# Patient Record
Sex: Male | Born: 1990 | Race: Black or African American | Hispanic: No | Marital: Single | Smoking: Never smoker
Health system: Southern US, Community
[De-identification: ages and names within clinical notes are randomized; demographics above are authoritative.]

## PROBLEM LIST (undated history)

## (undated) DIAGNOSIS — D571 Sickle-cell disease without crisis: Secondary | ICD-10-CM

## (undated) DIAGNOSIS — I10 Essential (primary) hypertension: Secondary | ICD-10-CM

## (undated) HISTORY — PX: CHOLECYSTECTOMY: SHX55

---

## 2010-10-18 ENCOUNTER — Emergency Department (HOSPITAL_COMMUNITY): Payer: Medicaid Other

## 2010-10-18 ENCOUNTER — Emergency Department (HOSPITAL_COMMUNITY)
Admission: EM | Admit: 2010-10-18 | Discharge: 2010-10-18 | Disposition: A | Discharge status: Home / Self Care | Payer: Medicaid Other | Attending: Emergency Medicine | Admitting: Emergency Medicine

## 2010-10-18 DIAGNOSIS — J45909 Unspecified asthma, uncomplicated: Secondary | ICD-10-CM | POA: Insufficient documentation

## 2010-10-18 DIAGNOSIS — L97309 Non-pressure chronic ulcer of unspecified ankle with unspecified severity: Secondary | ICD-10-CM | POA: Insufficient documentation

## 2010-10-18 DIAGNOSIS — I10 Essential (primary) hypertension: Secondary | ICD-10-CM | POA: Insufficient documentation

## 2010-10-18 DIAGNOSIS — M79609 Pain in unspecified limb: Secondary | ICD-10-CM | POA: Insufficient documentation

## 2010-10-18 DIAGNOSIS — D571 Sickle-cell disease without crisis: Secondary | ICD-10-CM | POA: Insufficient documentation

## 2010-10-18 LAB — SEDIMENTATION RATE: Sed Rate: 20 mm/hr — ABNORMAL HIGH (ref 0–16)

## 2010-10-18 LAB — CBC
Hemoglobin: 10.1 g/dL — ABNORMAL LOW (ref 13.0–17.0)
MCH: 40.9 pg — ABNORMAL HIGH (ref 26.0–34.0)
MCHC: 36.5 g/dL — ABNORMAL HIGH (ref 30.0–36.0)
RDW: 18.8 % — ABNORMAL HIGH (ref 11.5–15.5)

## 2010-10-18 LAB — DIFFERENTIAL
Eosinophils Absolute: 0.1 10*3/uL (ref 0.0–0.7)
Eosinophils Relative: 1 % (ref 0–5)
Lymphs Abs: 1.1 10*3/uL (ref 0.7–4.0)
Monocytes Absolute: 0.6 10*3/uL (ref 0.1–1.0)
Neutrophils Relative %: 76 % (ref 43–77)

## 2010-10-20 LAB — WOUND CULTURE

## 2010-10-24 LAB — CULTURE, BLOOD (ROUTINE X 2): Culture  Setup Time: 201209141233

## 2010-10-31 ENCOUNTER — Encounter (HOSPITAL_BASED_OUTPATIENT_CLINIC_OR_DEPARTMENT_OTHER): Payer: Medicaid Other | Attending: Internal Medicine

## 2010-10-31 DIAGNOSIS — D571 Sickle-cell disease without crisis: Secondary | ICD-10-CM | POA: Insufficient documentation

## 2010-10-31 DIAGNOSIS — X58XXXA Exposure to other specified factors, initial encounter: Secondary | ICD-10-CM | POA: Insufficient documentation

## 2010-10-31 DIAGNOSIS — S81009A Unspecified open wound, unspecified knee, initial encounter: Secondary | ICD-10-CM | POA: Insufficient documentation

## 2010-10-31 DIAGNOSIS — I10 Essential (primary) hypertension: Secondary | ICD-10-CM | POA: Insufficient documentation

## 2010-10-31 NOTE — Progress Notes (Unsigned)
[facility]  NAME:  Cameron [name], Cameron [name]               ACCOUNT NO.:  [id number]  MEDICAL RECORD NO.:  [id number]      DATE OF BIRTH:  1990-03-23  PHYSICIAN:  Maxwell Caul, M.D. VISIT DATE:  10/31/2010                                  OFFICE VISIT   HISTORY:  Cameron [name] is a 20 year old man who has sickle cell anemia.  By his description, his disease has been fairly well-controlled and he had not had any crisis in several years.  He was on hydroxyurea.  Roughly 4- 5 weeks ago, he developed 2 small areas on his posterior right Achilles area.  He thinks he might have scratched these areas.  Over the subsequent weeks, the pain in the area increased rapidly and he developed open wounds.  He has been seen both at [facility] and was given a course of antibiotics.  They are Augmentin.  He has been to [facility] Emergency Department as well.  Lab work there showed hemoglobin of 10.1, white count of 7.6, platelet count of 322.  Plain x- ray of the area showed swelling of the soft tissues of the ankle.  There was err in the Achilles tendon 3.5 cm above the superior margin of the calcaneus.  There was no osseous abnormalities.  His sedimentation rate was 20.  The area was currently so painful.  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  PAST MEDICAL HISTORY: 1. Hypertension. 2. Sickle cell anemia with no recent crisis.  His hydroxyurea was discontinued last week by the physician caring for him, I think due to concerns about the underlying wounds.  Current medications include ibuprofen p.r.n., hydroxyurea this was discontinued, lisinopril 10 mg daily.  PHYSICAL EXAMINATION:  VITAL SIGNS:  His temperature is 99.7, pulse 93, respirations 18, blood pressure 131/72. RESPIRATORY:  Clear air entry bilaterally. CARDIAC:  Heart sounds are normal.  There are no murmurs. ABDOMEN:  No liver, no spleen is palpable. EXTREMITIES:  His peripheral pulses are  intact.  WOUND EXAM:  There are two difficult areas here.  Both of these are on the posterior right ankle surrounding the calcaneus.  The actual medial wound is 2.7 x 3 x 0.2 on the lateral side posteriorly 1.5 x 1.9 x 0.2 medially.  There is clearly exposed Achilles here.  I think there is some surrounding tissue at risk.  There was no overt infection here.  I went ahead and did excisional debridements here.  He does not tolerate this very well.  Unfortunately, there is much more debriding that is going to need to be done.  IMPRESSION:  Ankle wounds as described above.  This in the setting of sickle cell anemia and I think the pathogenesis of this is associated with this.  Discontinue his hydroxyurea as appropriate at least for the short-term.  I attempted debridement here with limited success.  He is going to need ongoing treatment with Santyl, which we have arranged. Covered this with a Vaseline gauze and a Profore Lite wrap, which he does not have to remove.  I am thinking that unfortunately, he may need debridement under some form of general anesthetic, I was not able to do enough here.  As mentioned, there is exposed Achilles tendon here,  I am not really surprised that there is err here.  I did not think there was deep underlying soft tissue infection.          ______________________________ Maxwell Caul, M.D.     MGR/MEDQ  D:  10/31/2010  T:  10/31/2010  Job:  [id number]

## 2010-11-07 ENCOUNTER — Encounter (HOSPITAL_BASED_OUTPATIENT_CLINIC_OR_DEPARTMENT_OTHER): Payer: Medicaid Other | Attending: Internal Medicine

## 2010-11-07 DIAGNOSIS — S91009A Unspecified open wound, unspecified ankle, initial encounter: Secondary | ICD-10-CM | POA: Insufficient documentation

## 2010-11-07 DIAGNOSIS — X58XXXA Exposure to other specified factors, initial encounter: Secondary | ICD-10-CM | POA: Insufficient documentation

## 2010-11-07 DIAGNOSIS — D571 Sickle-cell disease without crisis: Secondary | ICD-10-CM | POA: Insufficient documentation

## 2010-11-07 DIAGNOSIS — S81009A Unspecified open wound, unspecified knee, initial encounter: Secondary | ICD-10-CM | POA: Insufficient documentation

## 2010-11-07 DIAGNOSIS — I10 Essential (primary) hypertension: Secondary | ICD-10-CM | POA: Insufficient documentation

## 2010-12-05 ENCOUNTER — Encounter (HOSPITAL_BASED_OUTPATIENT_CLINIC_OR_DEPARTMENT_OTHER): Payer: Medicaid Other | Attending: Internal Medicine

## 2010-12-05 DIAGNOSIS — S91309A Unspecified open wound, unspecified foot, initial encounter: Secondary | ICD-10-CM | POA: Insufficient documentation

## 2010-12-05 DIAGNOSIS — X58XXXA Exposure to other specified factors, initial encounter: Secondary | ICD-10-CM | POA: Insufficient documentation

## 2010-12-05 DIAGNOSIS — D5720 Sickle-cell/Hb-C disease without crisis: Secondary | ICD-10-CM | POA: Insufficient documentation

## 2010-12-23 ENCOUNTER — Other Ambulatory Visit: Payer: Self-pay

## 2010-12-23 ENCOUNTER — Encounter: Payer: Self-pay | Admitting: *Deleted

## 2010-12-23 ENCOUNTER — Emergency Department (HOSPITAL_COMMUNITY): Payer: Medicaid Other

## 2010-12-23 ENCOUNTER — Inpatient Hospital Stay (HOSPITAL_COMMUNITY)
Admission: EM | Admit: 2010-12-23 | Discharge: 2010-12-30 | DRG: 811 | Disposition: A | Discharge status: Home / Self Care | Payer: Medicaid Other | Attending: Internal Medicine | Admitting: Internal Medicine

## 2010-12-23 DIAGNOSIS — M549 Dorsalgia, unspecified: Secondary | ICD-10-CM | POA: Diagnosis present

## 2010-12-23 DIAGNOSIS — J40 Bronchitis, not specified as acute or chronic: Secondary | ICD-10-CM | POA: Diagnosis present

## 2010-12-23 DIAGNOSIS — R509 Fever, unspecified: Secondary | ICD-10-CM | POA: Diagnosis not present

## 2010-12-23 DIAGNOSIS — R079 Chest pain, unspecified: Secondary | ICD-10-CM | POA: Diagnosis present

## 2010-12-23 DIAGNOSIS — I10 Essential (primary) hypertension: Secondary | ICD-10-CM | POA: Diagnosis present

## 2010-12-23 DIAGNOSIS — J9600 Acute respiratory failure, unspecified whether with hypoxia or hypercapnia: Secondary | ICD-10-CM | POA: Diagnosis present

## 2010-12-23 DIAGNOSIS — D72829 Elevated white blood cell count, unspecified: Secondary | ICD-10-CM | POA: Diagnosis present

## 2010-12-23 DIAGNOSIS — J189 Pneumonia, unspecified organism: Secondary | ICD-10-CM | POA: Diagnosis present

## 2010-12-23 DIAGNOSIS — R071 Chest pain on breathing: Secondary | ICD-10-CM | POA: Diagnosis present

## 2010-12-23 DIAGNOSIS — R000 Tachycardia, unspecified: Secondary | ICD-10-CM | POA: Diagnosis not present

## 2010-12-23 DIAGNOSIS — D5700 Hb-SS disease with crisis, unspecified: Principal | ICD-10-CM | POA: Diagnosis present

## 2010-12-23 DIAGNOSIS — L97409 Non-pressure chronic ulcer of unspecified heel and midfoot with unspecified severity: Secondary | ICD-10-CM | POA: Diagnosis present

## 2010-12-23 HISTORY — DX: Essential (primary) hypertension: I10

## 2010-12-23 HISTORY — DX: Sickle-cell disease without crisis: D57.1

## 2010-12-23 LAB — DIFFERENTIAL
Basophils Absolute: 0 10*3/uL (ref 0.0–0.1)
Basophils Relative: 0 % (ref 0–1)
Eosinophils Absolute: 0.4 10*3/uL (ref 0.0–0.7)
Eosinophils Relative: 2 % (ref 0–5)
Lymphocytes Relative: 10 % — ABNORMAL LOW (ref 12–46)
Lymphs Abs: 1.8 10*3/uL (ref 0.7–4.0)
Monocytes Absolute: 1.6 10*3/uL — ABNORMAL HIGH (ref 0.1–1.0)
Monocytes Relative: 9 % (ref 3–12)
Neutro Abs: 13.9 10*3/uL — ABNORMAL HIGH (ref 1.7–7.7)
Neutrophils Relative %: 79 % — ABNORMAL HIGH (ref 43–77)

## 2010-12-23 LAB — URINALYSIS, ROUTINE W REFLEX MICROSCOPIC
Bilirubin Urine: NEGATIVE
Glucose, UA: NEGATIVE mg/dL
Hgb urine dipstick: NEGATIVE
Ketones, ur: NEGATIVE mg/dL
Leukocytes, UA: NEGATIVE
Nitrite: NEGATIVE
Protein, ur: NEGATIVE mg/dL
Specific Gravity, Urine: 1.013 (ref 1.005–1.030)
Urobilinogen, UA: 1 mg/dL (ref 0.0–1.0)
pH: 6 (ref 5.0–8.0)

## 2010-12-23 LAB — CBC
HCT: 24.8 % — ABNORMAL LOW (ref 39.0–52.0)
Hemoglobin: 8.8 g/dL — ABNORMAL LOW (ref 13.0–17.0)
MCH: 31.1 pg (ref 26.0–34.0)
MCHC: 35.5 g/dL (ref 30.0–36.0)
MCV: 87.6 fL (ref 78.0–100.0)
Platelets: 525 10*3/uL — ABNORMAL HIGH (ref 150–400)
RBC: 2.83 MIL/uL — ABNORMAL LOW (ref 4.22–5.81)
RDW: 27.7 % — ABNORMAL HIGH (ref 11.5–15.5)
WBC: 17.7 10*3/uL — ABNORMAL HIGH (ref 4.0–10.5)

## 2010-12-23 LAB — RETICULOCYTES
RBC.: 2.83 MIL/uL — ABNORMAL LOW (ref 4.22–5.81)
Retic Count, Absolute: 594.3 10*3/uL — ABNORMAL HIGH (ref 19.0–186.0)
Retic Ct Pct: 21 % — ABNORMAL HIGH (ref 0.4–3.1)

## 2010-12-23 LAB — BASIC METABOLIC PANEL
BUN: 6 mg/dL (ref 6–23)
CO2: 24 mEq/L (ref 19–32)
Calcium: 9.6 mg/dL (ref 8.4–10.5)
Chloride: 98 mEq/L (ref 96–112)
Creatinine, Ser: 0.51 mg/dL (ref 0.50–1.35)
GFR calc Af Amer: >90 mL/min (ref >90)
GFR calc non Af Amer: >90 mL/min (ref >90)
Glucose, Bld: 85 mg/dL (ref 70–99)
Potassium: 4 mEq/L (ref 3.5–5.1)
Sodium: 132 mEq/L — ABNORMAL LOW (ref 135–145)

## 2010-12-23 MED ORDER — HYDROMORPHONE HCL PF 2 MG/ML IJ SOLN
2.0000 mg | Freq: Once | INTRAMUSCULAR | Status: AC
  Administered 2010-12-23: 2 mg via INTRAVENOUS

## 2010-12-23 MED ORDER — SODIUM CHLORIDE 0.9 % IV SOLN
Freq: Once | INTRAVENOUS | Status: AC
  Administered 2010-12-23: 14:00:00 via INTRAVENOUS

## 2010-12-23 MED ORDER — HEPARIN SODIUM (PORCINE) 5000 UNIT/ML IJ SOLN
5000.0000 [IU] | Freq: Three times a day (TID) | INTRAMUSCULAR | Status: DC
  Administered 2010-12-23 – 2010-12-30 (×20): 5000 [IU] via SUBCUTANEOUS
  Filled 2010-12-23 (×27): qty 1

## 2010-12-23 MED ORDER — THERA M PLUS PO TABS
1.0000 | ORAL_TABLET | Freq: Every day | ORAL | Status: DC
  Administered 2010-12-24 – 2010-12-30 (×7): 1 via ORAL
  Filled 2010-12-23 (×8): qty 1

## 2010-12-23 MED ORDER — LISINOPRIL 10 MG PO TABS
10.0000 mg | ORAL_TABLET | Freq: Every day | ORAL | Status: DC
  Administered 2010-12-24 – 2010-12-30 (×7): 10 mg via ORAL
  Filled 2010-12-23 (×8): qty 1

## 2010-12-23 MED ORDER — ONDANSETRON HCL 4 MG PO TABS
4.0000 mg | ORAL_TABLET | ORAL | Status: DC | PRN
  Administered 2010-12-28 – 2010-12-29 (×3): 4 mg via ORAL
  Filled 2010-12-23 (×2): qty 1

## 2010-12-23 MED ORDER — HYDROXYZINE HCL 25 MG PO TABS
25.0000 mg | ORAL_TABLET | ORAL | Status: DC | PRN
  Administered 2010-12-24: 25 mg via ORAL
  Filled 2010-12-23: qty 1

## 2010-12-23 MED ORDER — IBUPROFEN 600 MG PO TABS
600.0000 mg | ORAL_TABLET | Freq: Three times a day (TID) | ORAL | Status: DC
  Administered 2010-12-23 – 2010-12-30 (×20): 600 mg via ORAL
  Filled 2010-12-23 (×25): qty 1

## 2010-12-23 MED ORDER — SODIUM CHLORIDE 0.9 % IV SOLN
INTRAVENOUS | Status: DC
  Administered 2010-12-23: via INTRAVENOUS

## 2010-12-23 MED ORDER — DOCUSATE SODIUM 100 MG PO CAPS
100.0000 mg | ORAL_CAPSULE | Freq: Two times a day (BID) | ORAL | Status: DC
  Administered 2010-12-23 – 2010-12-30 (×14): 100 mg via ORAL
  Filled 2010-12-23 (×19): qty 1

## 2010-12-23 MED ORDER — LORAZEPAM 2 MG/ML IJ SOLN
0.5000 mg | INTRAMUSCULAR | Status: DC | PRN
  Administered 2010-12-25: 1 mg via INTRAVENOUS
  Filled 2010-12-23: qty 1

## 2010-12-23 MED ORDER — PANTOPRAZOLE SODIUM 40 MG IV SOLR
40.0000 mg | Freq: Every day | INTRAVENOUS | Status: DC
  Administered 2010-12-23 – 2010-12-27 (×2): 40 mg via INTRAVENOUS
  Filled 2010-12-23 (×9): qty 40

## 2010-12-23 MED ORDER — PANTOPRAZOLE SODIUM 40 MG PO TBEC
40.0000 mg | DELAYED_RELEASE_TABLET | Freq: Every day | ORAL | Status: DC
  Administered 2010-12-25 – 2010-12-30 (×5): 40 mg via ORAL
  Filled 2010-12-23 (×8): qty 1

## 2010-12-23 MED ORDER — FOLIC ACID 1 MG PO TABS
1.0000 mg | ORAL_TABLET | Freq: Every day | ORAL | Status: DC
  Administered 2010-12-24 – 2010-12-30 (×7): 1 mg via ORAL
  Filled 2010-12-23 (×8): qty 1

## 2010-12-23 MED ORDER — LORAZEPAM 0.5 MG PO TABS
0.5000 mg | ORAL_TABLET | Freq: Four times a day (QID) | ORAL | Status: DC | PRN
  Administered 2010-12-26 – 2010-12-27 (×2): 0.5 mg via ORAL
  Filled 2010-12-23 (×2): qty 1

## 2010-12-23 MED ORDER — ONDANSETRON HCL 4 MG/2ML IJ SOLN
4.0000 mg | INTRAMUSCULAR | Status: DC | PRN
  Administered 2010-12-27: 4 mg via INTRAVENOUS
  Filled 2010-12-23 (×2): qty 2

## 2010-12-23 MED ORDER — IOHEXOL 300 MG/ML  SOLN
100.0000 mL | Freq: Once | INTRAMUSCULAR | Status: AC | PRN
  Administered 2010-12-23: 100 mL via INTRAVENOUS

## 2010-12-23 MED ORDER — ONDANSETRON HCL 4 MG/2ML IJ SOLN
4.0000 mg | Freq: Once | INTRAMUSCULAR | Status: AC
  Administered 2010-12-23: 4 mg via INTRAVENOUS

## 2010-12-23 MED ORDER — HYDROMORPHONE HCL PF 1 MG/ML IJ SOLN
INTRAMUSCULAR | Status: AC
  Filled 2010-12-23: qty 1

## 2010-12-23 MED ORDER — MAGNESIUM HYDROXIDE 400 MG/5ML PO SUSP: 30.0000 mL | Freq: Two times a day (BID) | ORAL | Status: DC | PRN

## 2010-12-23 MED ORDER — ANIMAL SHAPES WITH C & FA PO CHEW: 1.0000 | CHEWABLE_TABLET | Freq: Every day | ORAL | Status: DC

## 2010-12-23 MED ORDER — HYDROMORPHONE HCL PF 2 MG/ML IJ SOLN
1.0000 mg | INTRAMUSCULAR | Status: DC | PRN
  Administered 2010-12-24 (×5): 1 mg via INTRAVENOUS
  Filled 2010-12-23 (×5): qty 1

## 2010-12-23 MED ORDER — SODIUM CHLORIDE 0.9 % IV BOLUS (SEPSIS)
2000.0000 mL | Freq: Once | INTRAVENOUS | Status: AC
  Administered 2010-12-23: 2000 mL via INTRAVENOUS

## 2010-12-23 MED ORDER — HYDROMORPHONE HCL PF 1 MG/ML IJ SOLN
INTRAMUSCULAR | Status: AC
  Administered 2010-12-24: 02:00:00
  Filled 2010-12-23: qty 1

## 2010-12-23 MED ORDER — MOXIFLOXACIN HCL 400 MG PO TABS
400.0000 mg | ORAL_TABLET | Freq: Every day | ORAL | Status: DC
  Administered 2010-12-23 – 2010-12-29 (×7): 400 mg via ORAL
  Filled 2010-12-23 (×10): qty 1

## 2010-12-23 NOTE — H&P (Addendum)
History and Physical Examination  Date: 12/23/2010  Patient name: Cameron [name] Medical record number: [id number] Date of birth: 01-23-1991 Age: 20 y.o. Gender: male PCP: No primary provider on file.  Attending physician: Marcellus Scott  Chief Complaint: Chest pain  History of Present Illness: Cameron [name] is an 20 y.o. male who is currently in the [place] area for school and reports that he started having chest pain yesterday morning when he woke up.  He reports that he was having pain in the center of his chest and having some shortness of breath and coughing.  He reports that he has not had a sickle cell crisis episode in over 5 years.  Recently the patient was taken off his her drops urea because of ulcers on his ankles that were slow to heal.  The patient says that he had been doing fine up until yesterday.  The patient was initially seen at the emergency department in [facility] where he lives and sent home.  He says that the pain medications did not help.  The patient came to [facility] for school and was not able to go because of pain.  He came to the [facility] emergency department because of chronic persistent shortness of breath and chest pain.  In the emergency department he was found to have a hemoglobin of 8.8 anion elevated white blood cell count.  The patient was given IV fluids and IV pain medications with no significant relief in symptoms.  He also had a CT angiogram of his chest that was negative.  Hospital admission was requested for further treatment and management of this acute episode.   Past Medical History  Diagnosis Date  . Sickle cell anemia   . Hypertension   . Asthma     Meds:  Prior to Admission medications   Medication Sig Start Date End Date Taking? Authorizing Provider  ibuprofen (ADVIL,MOTRIN) 600 MG tablet Take 600 mg by mouth every 8 (eight) hours as needed. For pain    Yes Historical Provider, MD  lisinopril (PRINIVIL,ZESTRIL) 10 MG  tablet Take 10 mg by mouth daily.     Yes Historical Provider, MD  Pediatric Multiple Vitamins (FLINTSTONES MULTIVITAMIN PO) Take 1 tablet by mouth daily.     Yes Historical Provider, MD  hydroxyurea (HYDREA) 500 MG capsule Take 1,500 mg by mouth daily. May take with food to minimize GI side effects.     Historical Provider, MD    Allergies: Review of patient's allergies indicates no known allergies.  Social History:  History   Social History  . Marital Status: Single    Spouse Name: N/A    Number of Children: N/A  . Years of Education: N/A   Occupational History  . Not on file.   Social History Main Topics  . Smoking status: Never Smoker   . Smokeless tobacco: Never Used  . Alcohol Use: No  . Drug Use: No  . Sexually Active:    Other Topics Concern  . Not on file   Social History Narrative  . No narrative on file   Family History: History reviewed. No pertinent family history. Past Surgical History:  Past Surgical History  Procedure Date  . Cholecystectomy     Review of Systems: A comprehensive review of systems was negative except for: Constitutional: positive for fatigue Respiratory: positive for cough and dyspnea on exertion Cardiovascular: positive for chest pain and chest pressure/discomfort Musculoskeletal: positive for back pain   Physical Exam: Blood pressure 122/50, pulse 103,  temperature 98.7 F (37.1 C), temperature source Oral, resp. rate 24, SpO2 100.00%. General appearance: alert, cooperative, appears stated age and mild distress Head: Normocephalic, without obvious abnormality, atraumatic Eyes: icteric sclera bilateral Nose: Nares normal. Septum midline. Mucosa normal. No drainage or sinus tenderness., no discharge Back: negative, symmetric, no curvature. ROM normal. No CVA tenderness. Lungs: shallow BS bilateral, pain with taking a breath.  Heart: regular rate and rhythm, S1, S2 normal, no murmur, click, rub or gallop Abdomen: soft,  non-tender; bowel sounds normal; no masses,  no organomegaly Extremities: ankles wrapped bilateral for ulcerations Skin: ulcers - chronic,slow healing bilateral ankles  Lab results:  Basename 12/23/10 1030  NA 132*  K 4.0  CL 98  CO2 24  GLUCOSE 85  BUN 6  CREATININE 0.51  CALCIUM 9.6  MG --  PHOS --   No results found for this basename: AST:2,ALT:2,ALKPHOS:2,BILITOT:2,PROT:2,ALBUMIN:2 in the last 72 hours No results found for this basename: LIPASE:2,AMYLASE:2 in the last 72 hours  Basename 12/23/10 1030  WBC 17.7*  NEUTROABS 13.9*  HGB 8.8*  HCT 24.8*  MCV 87.6  PLT 525*   No results found for this basename: CKTOTAL:3,CKMB:3,CKMBINDEX:3,TROPONINI:3 in the last 72 hours No results found for this basename: TSH,T4TOTAL,FREET3,T3FREE,THYROIDAB in the last 72 hours  Basename 12/23/10 1030  VITAMINB12 --  FOLATE --  FERRITIN --  TIBC --  IRON --  RETICCTPCT 21.0*    EKG Results:  No orders found for this or any previous visit.  Imaging results:  Dg Chest 2 View  12/23/2010  *RADIOLOGY REPORT*  Clinical Data: Chest pain and shortness of breath.  History of sickle cell.  CHEST - 2 VIEW  Comparison: None.  Findings: The heart size and mediastinal contours are normal. There is mild diffuse central airway thickening without hyperinflation or confluent airspace opacity.  There is no pleural effusion.  The osseous structures appear unremarkable aside from a mild scoliosis which may be positional.  Cholecystectomy clips are noted.  IMPRESSION: Mild central airway thickening suggesting possible bronchitis.  No evidence of pneumonia.  Original Report Authenticated By: Gerrianne Scale, M.D.   Ct Angio Chest W/cm &/or Wo Cm  12/23/2010  *RADIOLOGY REPORT*  Clinical Data:  Chest pain and shortness of breath.  Sickle cell crisis.  CT ANGIOGRAPHY CHEST WITH CONTRAST  Technique:  Multidetector CT imaging of the chest was performed using the standard protocol during bolus  administration of intravenous contrast.  Multiplanar CT image reconstructions including MIPs were obtained to evaluate the vascular anatomy.  Contrast: OMNIPAQUE IOHEXOL 300 MG/ML IV SOLN  Comparison:  Chest x-ray dated 12/23/2010  Findings:  There are no pulmonary emboli.  There is interstitial disease and volume loss in both lower lobes, right greater than left.  However, the bronchi do not appear obstructed.  No effusions.  No hilar or mediastinal adenopathy.  The heart size is normal. Mild thoracic scoliosis.  No acute osseous abnormality.  Review of the MIP images confirms the above findings.  IMPRESSION:  1.  No pulmonary emboli. 2. Volume loss and interstitial disease in both lower lobes, right greater than left without consolidative infiltrates.  Original Report Authenticated By: Gwynn Burly, M.D.     Impression  Active Problems:  Sickle cell anemia with crisis  Chest pain  Bronchitis  Chronic heel ulcer - bilateral, slow healing  Back pain  Hypertension  Plan   1.  Admit to medical bed 2.  Continue IVFs and pain medications and start the sickle cell  order set.  3.  Monitor closely with serial CBC tests and request hematology consultation.  4.  Consult wound care nurse to evaluate patient's ankle ulcers and to assist with dressing changes. 5.  Start Avelox po for treatment of the acute bronchitis condition/ respiratory infection. 6.  Please see orders  And hospital records for further details of the admission.   7.  Continue blood pressure medication (lisinopril).     Tyshaun Vinzant 12/23/2010, 4:06 PM

## 2010-12-23 NOTE — ED Notes (Signed)
Patient transported to CT 

## 2010-12-23 NOTE — ED Provider Notes (Signed)
History     CSN: [id number] Arrival date & time: 12/23/2010  9:59 AM   First MD Initiated Contact with Patient 12/23/10 1020      Chief Complaint  Patient presents with  . Chest Pain    sickle cell     (Consider location/radiation/quality/duration/timing/severity/associated sxs/prior treatment) Patient is a 20 y.o. [name] presenting with chest pain. The history is provided by the patient.  Chest Pain    patient presents to the emergency department with chest pain, and sickle cell crisis.  Patient, states that the pain started one day ago.  He was seen in emergency department [place].  He denies any shortness of breath, weakness, numbness, nausea, vomiting, diarrhea, abdominal pain, or headache.  He states is similar to his previous sickle cell crisis, which was 6 years ago.  He states is not currently taking his hydroxyurea and feels this may be the reason for his exacerbation.  Past Medical History  Diagnosis Date  . Sickle cell anemia   . Hypertension   . Asthma     Past Surgical History  Procedure Date  . Cholecystectomy     History reviewed. No pertinent family history.  History  Substance Use Topics  . Smoking status: Never Smoker   . Smokeless tobacco: Not on file  . Alcohol Use: No      Review of Systems  Cardiovascular: Positive for chest pain.   all pertinent positives and negatives were reviewed in the history of present illness  Allergies  Review of patient's allergies indicates no known allergies.  Home Medications   Current Outpatient Rx  Name Route Sig Dispense Refill  . IBUPROFEN 600 MG PO TABS Oral Take 600 mg by mouth every 8 (eight) hours as needed. For pain     . LISINOPRIL 10 MG PO TABS Oral Take 10 mg by mouth daily.      Marland Kitchen FLINTSTONES MULTIVITAMIN PO Oral Take 1 tablet by mouth daily.      Marland Kitchen HYDROXYUREA 500 MG PO CAPS Oral Take 1,500 mg by mouth daily. May take with food to minimize GI side effects.       BP 122/50   Pulse 103  Temp(Src) 98.7 F (37.1 C) (Oral)  Resp 24  SpO2 100%  Physical Exam  Constitutional: He is oriented to person, place, and time. He appears well-developed and well-nourished.  HENT:  Head: Normocephalic and atraumatic.  Eyes: Conjunctivae are normal. Pupils are equal, round, and reactive to light.  Cardiovascular: Normal rate, regular rhythm and normal heart sounds.   Pulmonary/Chest: Effort normal and breath sounds normal.  Abdominal: Soft. Bowel sounds are normal. He exhibits no distension.  Musculoskeletal: Normal range of motion.  Neurological: He is alert and oriented to person, place, and time.  Skin: Skin is warm and dry. No rash noted.  Psychiatric: He has a normal mood and affect. His behavior is normal. Judgment and thought content normal.    ED Course  Procedures (including critical care time)  Labs Reviewed  BASIC METABOLIC PANEL - Abnormal; Notable for the following:    Sodium 132 (*)    All other components within normal limits  RETICULOCYTES - Abnormal; Notable for the following:    Retic Ct Pct 21.0 (*) RESULTS CONFIRMED BY MANUAL DILUTION   RBC. 2.83 (*)    Retic Count, Manual 594.3 (*)    All other components within normal limits  CBC - Abnormal; Notable for the following:    WBC 17.7 (*)  RBC 2.83 (*)    Hemoglobin 8.8 (*)    HCT 24.8 (*)    RDW 27.7 (*)    Platelets 525 (*)    All other components within normal limits  DIFFERENTIAL - Abnormal; Notable for the following:    Neutrophils Relative 79 (*)    Lymphocytes Relative 10 (*)    Neutro Abs 13.9 (*)    Monocytes Absolute 1.6 (*)    All other components within normal limits  URINALYSIS, ROUTINE W REFLEX MICROSCOPIC   Dg Chest 2 View  12/23/2010  *RADIOLOGY REPORT*  Clinical Data: Chest pain and shortness of breath.  History of sickle cell.  CHEST - 2 VIEW  Comparison: None.  Findings: The heart size and mediastinal contours are normal. There is mild diffuse central airway  thickening without hyperinflation or confluent airspace opacity.  There is no pleural effusion.  The osseous structures appear unremarkable aside from a mild scoliosis which may be positional.  Cholecystectomy clips are noted.  IMPRESSION: Mild central airway thickening suggesting possible bronchitis.  No evidence of pneumonia.  Original Report Authenticated By: Gerrianne Scale, M.D.   Ct Angio Chest W/cm &/or Wo Cm  12/23/2010  *RADIOLOGY REPORT*  Clinical Data:  Chest pain and shortness of breath.  Sickle cell crisis.  CT ANGIOGRAPHY CHEST WITH CONTRAST  Technique:  Multidetector CT imaging of the chest was performed using the standard protocol during bolus administration of intravenous contrast.  Multiplanar CT image reconstructions including MIPs were obtained to evaluate the vascular anatomy.  Contrast: OMNIPAQUE IOHEXOL 300 MG/ML IV SOLN  Comparison:  Chest x-ray dated 12/23/2010  Findings:  There are no pulmonary emboli.  There is interstitial disease and volume loss in both lower lobes, right greater than left.  However, the bronchi do not appear obstructed.  No effusions.  No hilar or mediastinal adenopathy.  The heart size is normal. Mild thoracic scoliosis.  No acute osseous abnormality.  Review of the MIP images confirms the above findings.  IMPRESSION:  1.  No pulmonary emboli. 2. Volume loss and interstitial disease in both lower lobes, right greater than left without consolidative infiltrates.  Original Report Authenticated By: Gwynn Burly, M.D.         MDM  Patient is to be admitted to the hospitalist.         Date: 12/23/2010  Rate: 112  Rhythm: sinus tachycardia  QRS Axis: normal  Intervals: normal  ST/T Wave abnormalities: normal  Conduction Disutrbances:none  Narrative Interpretation:   Old EKG Reviewed: none available    Carlyle Dolly, PA 12/23/10 1442  Carlyle Dolly, PA 12/23/10 2018

## 2010-12-23 NOTE — ED Notes (Signed)
Pt states yesterday he started to have chest pain and feeling sob.

## 2010-12-24 ENCOUNTER — Other Ambulatory Visit: Payer: Self-pay

## 2010-12-24 ENCOUNTER — Inpatient Hospital Stay (HOSPITAL_COMMUNITY): Payer: Medicaid Other

## 2010-12-24 LAB — RETICULOCYTES
RBC.: 2.27 MIL/uL — ABNORMAL LOW (ref 4.22–5.81)
Retic Count, Absolute: 379.1 10*3/uL — ABNORMAL HIGH (ref 19.0–186.0)
Retic Ct Pct: 16.7 % — ABNORMAL HIGH (ref 0.4–3.1)

## 2010-12-24 LAB — COMPREHENSIVE METABOLIC PANEL
Alkaline Phosphatase: 67 U/L (ref 39–117)
BUN: 6 mg/dL (ref 6–23)
Chloride: 102 mEq/L (ref 96–112)
GFR calc Af Amer: >90 mL/min (ref >90)
Glucose, Bld: 94 mg/dL (ref 70–99)
Potassium: 3.7 mEq/L (ref 3.5–5.1)
Total Bilirubin: 4.9 mg/dL — ABNORMAL HIGH (ref 0.3–1.2)

## 2010-12-24 LAB — CBC
Platelets: 382 10*3/uL (ref 150–400)
RDW: 27 % — ABNORMAL HIGH (ref 11.5–15.5)
WBC: 16.9 10*3/uL — ABNORMAL HIGH (ref 4.0–10.5)

## 2010-12-24 MED ORDER — HYDROMORPHONE HCL PF 2 MG/ML IJ SOLN
1.0000 mg | INTRAMUSCULAR | Status: DC | PRN
  Administered 2010-12-24 (×2): 2 mg via INTRAVENOUS
  Administered 2010-12-25 (×2): 1 mg via INTRAVENOUS
  Administered 2010-12-25 – 2010-12-28 (×8): 2 mg via INTRAVENOUS
  Filled 2010-12-24 (×13): qty 1

## 2010-12-24 MED ORDER — DIPHENHYDRAMINE HCL 25 MG PO CAPS: 25.0000 mg | ORAL_CAPSULE | Freq: Four times a day (QID) | ORAL | Status: DC | PRN

## 2010-12-24 MED ORDER — ACETAMINOPHEN 500 MG PO TABS
500.0000 mg | ORAL_TABLET | Freq: Four times a day (QID) | ORAL | Status: DC | PRN
  Administered 2010-12-24 – 2010-12-27 (×2): 1000 mg via ORAL
  Administered 2010-12-28: 500 mg via ORAL
  Filled 2010-12-24: qty 1
  Filled 2010-12-24 (×3): qty 2

## 2010-12-24 MED ORDER — VANCOMYCIN HCL IN DEXTROSE 1-5 GM/200ML-% IV SOLN
1000.0000 mg | Freq: Three times a day (TID) | INTRAVENOUS | Status: DC
  Filled 2010-12-24 (×3): qty 200

## 2010-12-24 MED ORDER — SODIUM CHLORIDE 0.45 % IV SOLN
INTRAVENOUS | Status: DC
  Administered 2010-12-24 – 2010-12-27 (×5): via INTRAVENOUS

## 2010-12-24 MED ORDER — LEVALBUTEROL HCL 0.63 MG/3ML IN NEBU
0.6300 mg | INHALATION_SOLUTION | RESPIRATORY_TRACT | Status: DC | PRN
  Administered 2010-12-24: 0.63 mg via RESPIRATORY_TRACT
  Filled 2010-12-24 (×3): qty 3

## 2010-12-24 NOTE — Significant Event (Addendum)
550a  Called for Hgb drop from 8.8 on admission to 6.9  This 20yoM with known sickle cell disease but no crises for at least 5 yrs presented last night with chest pain and difficulty breathing and was found to have Hgb 8.8 and WBC 17. His CXR and CTA show some volume loss and interstitial disease but no PNA. He also has an elevated Tbili suggestive sickle hemolysis. He's being treated for sickle crises.   Went to speak with him and he states the chest pain and SOB are improved since admission. He currently feels OK. Discussed the findings of acutely dropping Hgb and that the therapy for this would be transfusion. Described the risks / benefits of transfusion in quite a lot of detail with him.   He wants to think it over, which I think is reasonable at this point given he feels OK now and other than some tachycardia is stable. Kidneys are OK as well. I did tell him however that he probably would need a transfusion before leaving the hospital which he did not seem opposed to. I encouraged him to talk to his mom about it too.   Also, he has a sickle cell MD at [facility]. Will sign f/u of this to daytime [facility] MD.   - Added on reticulocyte count and other hemolysis indices to his am labs already drawn - Pt on Moxifloxacin, although does not have evidence of PNA on CT

## 2010-12-24 NOTE — Progress Notes (Signed)
Subjective: Patient says he feels whole lot better. Complains of mild right anterior and bilateral upper back pain only on deep inspiration or chest wall movement. He denies dyspnea or cough.    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 He does not know his baseline hemoglobin. He sees Dr. Sammuel Cooper at [facility] and had an appointment today. He was not recently transfused.  Objective: Blood pressure 130/65, pulse 107, temperature 98.5 F (36.9 C), temperature source Oral, resp. rate 18, height 5\' 6"  (1.676 m), weight 59.9 kg (132 lb 0.9 oz), SpO2 91.00%.  Intake/Output Summary (Last 24 hours) at 12/24/10 1104 Last data filed at 12/24/10 0500  Gross per 24 hour  Intake    240 ml  Output   1000 ml  Net   -760 ml   General exam: Young male who is moderately built and nourished and lying comfortably supine in bed and in no obvious distress. Respiratory system: Clear. No increased work of breathing. Cardiovascular system: First and second heart sounds heard, regular. No JVDs or murmurs Gastrointestinal system: Abdomen is nondistended, soft and nontender and normal bowel sounds heard. Central nervous system: Alert and oriented. No focal neurological deficits. Extremities: Patient has dressing on both his legs. He indicates that he has grafting done on the ulcers on his right leg and was last seen by his wound care center on Thursday.   Lab Results: Basic Metabolic Panel:  Basename 12/24/10 0354 12/23/10 1030  NA 136 132*  K 3.7 4.0  CL 102 98  CO2 26 24  GLUCOSE 94 85  BUN 6 6  CREATININE 0.61 0.51  CALCIUM 8.7 9.6  MG -- --  PHOS -- --   Liver Function Tests:  Basename 12/24/10 0354  AST 24  ALT 17  ALKPHOS 67  BILITOT 4.9*  PROT 7.0  ALBUMIN 3.4*   No results found for this basename: LIPASE:2,AMYLASE:2 in the last 72 hours No results found for this basename: AMMONIA:2 in the last 72 hours CBC:  Basename 12/24/10 0354 12/23/10 1030  WBC 16.9* 17.7*    NEUTROABS -- 13.9*  HGB 6.9* 8.8*  HCT 19.7* 24.8*  MCV 86.8 87.6  PLT 382 525*   Cardiac Enzymes: No results found for this basename: CKTOTAL:3,CKMB:3,CKMBINDEX:3,TROPONINI:3 in the last 72 hours BNP: No results found for this basename: POCBNP:3 in the last 72 hours D-Dimer: No results found for this basename: DDIMER:2 in the last 72 hours CBG: No results found for this basename: GLUCAP:6 in the last 72 hours Hemoglobin A1C: No results found for this basename: HGBA1C in the last 72 hours Fasting Lipid Panel: No results found for this basename: CHOL,HDL,LDLCALC,TRIG,CHOLHDL,LDLDIRECT in the last 72 hours Thyroid Function Tests: No results found for this basename: TSH,T4TOTAL,FREET4,T3FREE,THYROIDAB in the last 72 hours Anemia Panel:  Basename 12/24/10 0354  VITAMINB12 --  FOLATE --  FERRITIN --  TIBC --  IRON --  RETICCTPCT 16.7*   Coagulation: No results found for this basename: LABPROT:2,INR:2 in the last 72 hours Urine Drug Screen:  Alcohol Level: No results found for this basename: ETH:2 in the last 72 hours Urinalysis:  Misc. Labs:   Micro Results: No results found for this or any previous visit (from the past 240 hour(s)).  Studies/Results: Dg Chest 2 View  12/23/2010  *RADIOLOGY REPORT*  Clinical Data: Chest pain and shortness of breath.  History of sickle cell.  CHEST - 2 VIEW  Comparison: None.  Findings: The heart size and mediastinal contours are normal. There is  mild diffuse central airway thickening without hyperinflation or confluent airspace opacity.  There is no pleural effusion.  The osseous structures appear unremarkable aside from a mild scoliosis which may be positional.  Cholecystectomy clips are noted.  IMPRESSION: Mild central airway thickening suggesting possible bronchitis.  No evidence of pneumonia.  Original Report Authenticated By: Gerrianne Scale, M.D.   Ct Angio Chest W/cm &/or Wo Cm  12/23/2010  *RADIOLOGY REPORT*  Clinical Data:   Chest pain and shortness of breath.  Sickle cell crisis.  CT ANGIOGRAPHY CHEST WITH CONTRAST  Technique:  Multidetector CT imaging of the chest was performed using the standard protocol during bolus administration of intravenous contrast.  Multiplanar CT image reconstructions including MIPs were obtained to evaluate the vascular anatomy.  Contrast: OMNIPAQUE IOHEXOL 300 MG/ML IV SOLN  Comparison:  Chest x-ray dated 12/23/2010  Findings:  There are no pulmonary emboli.  There is interstitial disease and volume loss in both lower lobes, right greater than left.  However, the bronchi do not appear obstructed.  No effusions.  No hilar or mediastinal adenopathy.  The heart size is normal. Mild thoracic scoliosis.  No acute osseous abnormality.  Review of the MIP images confirms the above findings.  IMPRESSION:  1.  No pulmonary emboli. 2. Volume loss and interstitial disease in both lower lobes, right greater than left without consolidative infiltrates.  Original Report Authenticated By: Gwynn Burly, M.D.    Medications: Scheduled Meds:   . sodium chloride   Intravenous Once  . docusate sodium  100 mg Oral BID  . folic acid  1 mg Oral Daily  . heparin  5,000 Units Subcutaneous Q8H  . HYDROmorphone      .  HYDROmorphone (DILAUDID) injection  2 mg Intravenous Once  .  HYDROmorphone (DILAUDID) injection  2 mg Intravenous Once  . ibuprofen  600 mg Oral TID  . lisinopril  10 mg Oral Daily  . moxifloxacin  400 mg Oral q1800  . multivitamins ther. w/minerals  1 tablet Oral Daily  . pantoprazole  40 mg Oral Q1200   Or  . pantoprazole (PROTONIX) IV  40 mg Intravenous Q1200  . DISCONTD: multivitamin animal shapes (with Ca/FA)  1 tablet Oral Daily   Continuous Infusions:   . sodium chloride    . DISCONTD: sodium chloride 125 mL/hr at 12/23/10 2347   PRN Meds:.diphenhydrAMINE, HYDROmorphone, hydrOXYzine, iohexol, LORazepam, LORazepam, magnesium hydroxide, ondansetron (ZOFRAN) IV,  ondansetron  Assessment/Plan: 1. Hemoglobin SS disease with vaso-occlusive crisis. Change IV fluids to half normal saline and continue pain management. Provide incentive spirometer. 2. 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 Patient agreeable to packed red blood cell transfusion. We'll transfuse one unit of packed red blood cells and monitor post transfusion hemoglobin. 3. Chronic bilateral leg ulcers: Patient gets his care at the wound care center. Will obtain wound care consultation in the hospital. 4. Hypertension: Controlled 5. Question bronchitis: For now continue antibiotics.    Diogenes Whirley 12/24/2010, 11:04 AM

## 2010-12-24 NOTE — Progress Notes (Signed)
At approximately 1800, patient summoned the nurse with complaints of chest tightness and pain that was now a 9 out of 10 (previous highest score was 7).  He appeared dyspneic and had trouble catching his breath to speak.  O2 sat and pulse taken, and patient was 91% on 2L N/C with a pulse of 132.  2 mg of Dilaudid administered.    Patient also grabbed his backpack and began searching in earnest for something.  When questioned, he said he was looking for an inhaler, but that he must have left it at home.  This nurse reviewed his MAR and found no order for any inhalants.  Patient said he forgot to tell the pharmacy tech about this medication.  MD notified of all of the above.  New orders for Xopenex breathing treatments received.  Nursing staff was asked to continue to monitor the patient's chest pain/dyspnea, and call the on-call MD for orders for a CT of the chest if symptoms worsen.  Will continue to monitor.  Roney Mans. RN, BSN 12/24/2010 9:32 PM

## 2010-12-24 NOTE — Progress Notes (Addendum)
    Called for severe chest pain and hypoxia, per nursing pt was 78% on 2L [place] and HR 135.  Vitals on arrival 102.6  p139  120/56  99% on NRB. Pt was supposed to get blood transfusion, was abt to be hung however was stopped due to fever. Pt at present without chest pain, just difficulty breathing. Per aunt at bedside, he had pleuritic pain all day that has been getting worse, and is now diaphoretic and with more respiratory distress.   On exam, pt breathing better on NRB and can answer questions, is alert and more comfortable. Is very sweaty. Lungs with fair air movement, light coarse breath sounds but not bad, and possibly with decreased breath sounds at R base. Heart very tachy with flow type murmur. Abdomen benign.   I suspect this is all overall sickle cell crisis with hemolysis, for which we do need to transfuse him. This was planned, however pt was febrile. Therefore, we will get labs and CXR first, try to knock his fever down to normal temperature in order to give blood. He does have leg ulcers, fever, and high white count so infection is considered and pt on Moxifloxacin, so broadeding ABx not unreasonable.   - Keep on high flow O2, continue 1/2 NS, give 1gram Tylenol now and monitor fever, and give pain control  - EKG, CXR, CBC, chemistry, BCx x2, lactate  - Broaden ABx  -- add Vancomycin    EKG with sinus tachy 140, normal axis, normal P waves, no ST segment deviations, diffuse TWF/TWI. LVH.  CXR shows increased R lower consolidation and R pleural effusion   1a  Fever now resolved, pt has received blood transfusion. Vitals more stable. Await post transfusion Hgb. The one done earlier tonight was drawn pre-transfusion despite my writing in the requisition to not do until after. Regardless, await am labs

## 2010-12-25 DIAGNOSIS — J189 Pneumonia, unspecified organism: Secondary | ICD-10-CM | POA: Diagnosis present

## 2010-12-25 LAB — COMPREHENSIVE METABOLIC PANEL
ALT: 18 U/L (ref 0–53)
ALT: 21 U/L (ref 0–53)
Alkaline Phosphatase: 66 U/L (ref 39–117)
BUN: 9 mg/dL (ref 6–23)
CO2: 26 mEq/L (ref 19–32)
CO2: 26 mEq/L (ref 19–32)
Calcium: 8.7 mg/dL (ref 8.4–10.5)
Calcium: 9 mg/dL (ref 8.4–10.5)
Chloride: 103 mEq/L (ref 96–112)
Creatinine, Ser: 0.49 mg/dL — ABNORMAL LOW (ref 0.50–1.35)
GFR calc Af Amer: >90 mL/min (ref >90)
GFR calc Af Amer: >90 mL/min (ref >90)
GFR calc non Af Amer: >90 mL/min (ref >90)
GFR calc non Af Amer: >90 mL/min (ref >90)
Glucose, Bld: 115 mg/dL — ABNORMAL HIGH (ref 70–99)
Glucose, Bld: 94 mg/dL (ref 70–99)
Potassium: 3.6 mEq/L (ref 3.5–5.1)
Sodium: 136 mEq/L (ref 135–145)
Sodium: 137 mEq/L (ref 135–145)
Total Bilirubin: 3 mg/dL — ABNORMAL HIGH (ref 0.3–1.2)

## 2010-12-25 LAB — CBC
HCT: 19.7 % — ABNORMAL LOW (ref 39.0–52.0)
HCT: 27.4 % — ABNORMAL LOW (ref 39.0–52.0)
Hemoglobin: 6.7 g/dL — CL (ref 13.0–17.0)
MCH: 29.1 pg (ref 26.0–34.0)
MCHC: 33.9 g/dL (ref 30.0–36.0)
MCV: 87.5 fL (ref 78.0–100.0)
RBC: 2.3 MIL/uL — ABNORMAL LOW (ref 4.22–5.81)
RDW: 24.7 % — ABNORMAL HIGH (ref 11.5–15.5)

## 2010-12-25 LAB — DIFFERENTIAL
Basophils Absolute: 0 10*3/uL (ref 0.0–0.1)
Eosinophils Absolute: 0.7 10*3/uL (ref 0.0–0.7)
Eosinophils Absolute: 0.3 10*3/uL (ref 0.0–0.7)
Lymphs Abs: 2.3 10*3/uL (ref 0.7–4.0)
Lymphs Abs: 2 10*3/uL (ref 0.7–4.0)
Monocytes Absolute: 2.1 10*3/uL — ABNORMAL HIGH (ref 0.1–1.0)
Neutro Abs: 18.3 10*3/uL — ABNORMAL HIGH (ref 1.7–7.7)
Neutro Abs: 20.8 10*3/uL — ABNORMAL HIGH (ref 1.7–7.7)
Neutrophils Relative %: 78 % — ABNORMAL HIGH (ref 43–77)

## 2010-12-25 MED ORDER — GUAIFENESIN ER 600 MG PO TB12
600.0000 mg | ORAL_TABLET | Freq: Two times a day (BID) | ORAL | Status: DC
  Administered 2010-12-25 – 2010-12-30 (×11): 600 mg via ORAL
  Filled 2010-12-25 (×14): qty 1

## 2010-12-25 MED ORDER — VANCOMYCIN HCL IN DEXTROSE 1-5 GM/200ML-% IV SOLN
1000.0000 mg | Freq: Three times a day (TID) | INTRAVENOUS | Status: DC
  Administered 2010-12-25 – 2010-12-26 (×5): 1000 mg via INTRAVENOUS
  Filled 2010-12-25 (×7): qty 200

## 2010-12-25 NOTE — ED Provider Notes (Signed)
Medical screening examination/treatment/procedure(s) were performed by non-physician practitioner and as supervising physician I was immediately available for consultation/collaboration.  Lamaj Metoyer T Pollyann Roa, MD 12/25/10 1934 

## 2010-12-25 NOTE — Progress Notes (Signed)
Subjective: Patient was having fever and tachycardia overnight. He was transfused 1 unit of PRBC. His chest x-ray shows a possible developing a lower lobe pneumonia. At this time the patient reports that his breathing does feel better. He does have some right-sided chest pain on deep inspiration. He has also noticed the development of a bit of a cough. He does not appear to have recurrence of fever since last night.  Objective:  Vital signs in last 24 hours:  Filed Vitals:   12/25/10 0052 12/25/10 0152 12/25/10 0252 12/25/10 0533  BP: 129/62 128/64 119/56 128/69  Pulse: 88 78 73 95  Temp: 98.9 F (37.2 C) 98.7 F (37.1 C) 98.6 F (37 C) 97.6 F (36.4 C)  TempSrc: Oral Oral Oral Oral  Resp: 20 18 18 20   Height:      Weight:      SpO2:    100%    Intake/Output from previous day:   Intake/Output Summary (Last 24 hours) at 12/25/10 0942 Last data filed at 12/25/10 0100  Gross per 24 hour  Intake    610 ml  Output   1850 ml  Net  -1240 ml    Physical Exam: General: Alert, awake, oriented x3, in no acute distress. Appears comfortable HEENT: No bruits, no goiter. Heart: Regular rate and rhythm, without murmurs, rubs, gallops. Lungs: Decreased breath sounds bilaterally.. Abdomen: Soft, nontender, nondistended, positive bowel sounds. Extremities: Legs are wrapped in Ace bandage, this was not removed. Neuro: Grossly intact, nonfocal.   Lab Results:  Basic Metabolic Panel:    Component Value Date/Time   NA 137 12/25/2010 0650   K 3.8 12/25/2010 0650   CL 103 12/25/2010 0650   CO2 26 12/25/2010 0650   BUN 8 12/25/2010 0650   CREATININE 0.49* 12/25/2010 0650   GLUCOSE 94 12/25/2010 0650   CALCIUM 9.0 12/25/2010 0650   CBC:    Component Value Date/Time   WBC 25.1* 12/25/2010 0650   HGB 9.3* 12/25/2010 0650   HCT 27.4* 12/25/2010 0650   PLT 487* 12/25/2010 0650   MCV 87.5 12/25/2010 0650   NEUTROABS 20.8* 12/25/2010 0650   LYMPHSABS 2.0 12/25/2010 0650   MONOABS  2.0* 12/25/2010 0650   EOSABS 0.3 12/25/2010 0650   BASOSABS 0.0 12/25/2010 0650    No results found for this or any previous visit (from the past 240 hour(s)).  Studies/Results: Dg Chest 1 View  12/24/2010  *RADIOLOGY REPORT*  Clinical Data: Chest pain, sickle cell.  CHEST - 1 VIEW  Comparison: 12/23/2010 CT  Findings: Cardiomegaly.  Right pleural effusion.  Bibasilar opacities.  No pneumothorax.  No acute osseous abnormality. Surgical clips right upper quadrant.  IMPRESSION: Interval development of a right pleural effusion and bibasilar opacities; atelectasis versus pneumonia.  Original Report Authenticated By: Waneta Martins, M.D.   Dg Chest 2 View  12/23/2010  *RADIOLOGY REPORT*  Clinical Data: Chest pain and shortness of breath.  History of sickle cell.  CHEST - 2 VIEW  Comparison: None.  Findings: The heart size and mediastinal contours are normal. There is mild diffuse central airway thickening without hyperinflation or confluent airspace opacity.  There is no pleural effusion.  The osseous structures appear unremarkable aside from a mild scoliosis which may be positional.  Cholecystectomy clips are noted.  IMPRESSION: Mild central airway thickening suggesting possible bronchitis.  No evidence of pneumonia.  Original Report Authenticated By: Gerrianne Scale, M.D.   Ct Angio Chest W/cm &/or Wo Cm  12/23/2010  *RADIOLOGY REPORT*  Clinical Data:  Chest pain and shortness of breath.  Sickle cell crisis.  CT ANGIOGRAPHY CHEST WITH CONTRAST  Technique:  Multidetector CT imaging of the chest was performed using the standard protocol during bolus administration of intravenous contrast.  Multiplanar CT image reconstructions including MIPs were obtained to evaluate the vascular anatomy.  Contrast: OMNIPAQUE IOHEXOL 300 MG/ML IV SOLN  Comparison:  Chest x-ray dated 12/23/2010  Findings:  There are no pulmonary emboli.  There is interstitial disease and volume loss in both lower lobes, right  greater than left.  However, the bronchi do not appear obstructed.  No effusions.  No hilar or mediastinal adenopathy.  The heart size is normal. Mild thoracic scoliosis.  No acute osseous abnormality.  Review of the MIP images confirms the above findings.  IMPRESSION:  1.  No pulmonary emboli. 2. Volume loss and interstitial disease in both lower lobes, right greater than left without consolidative infiltrates.  Original Report Authenticated By: Gwynn Burly, M.D.    Medications: Scheduled Meds:   . docusate sodium  100 mg Oral BID  . folic acid  1 mg Oral Daily  . guaiFENesin  600 mg Oral BID  . heparin  5,000 Units Subcutaneous Q8H  . ibuprofen  600 mg Oral TID  . lisinopril  10 mg Oral Daily  . moxifloxacin  400 mg Oral q1800  . multivitamins ther. w/minerals  1 tablet Oral Daily  . pantoprazole  40 mg Oral Q1200   Or  . pantoprazole (PROTONIX) IV  40 mg Intravenous Q1200  . vancomycin  1,000 mg Intravenous Q8H  . DISCONTD: vancomycin  1,000 mg Intravenous Q8H   Continuous Infusions:   . sodium chloride 100 mL/hr at 12/24/10 2306  . DISCONTD: sodium chloride 125 mL/hr at 12/23/10 2347   PRN Meds:.acetaminophen, diphenhydrAMINE, HYDROmorphone, hydrOXYzine, levalbuterol, LORazepam, LORazepam, magnesium hydroxide, ondansetron (ZOFRAN) IV, ondansetron, DISCONTD: HYDROmorphone  Assessment/Plan:  Active Problems:  Sickle cell anemia with crisis  Chest pain  Bronchitis  Chronic heel ulcer  Back pain  Hypertension  PNA (pneumonia)  Plan:  Patient has been admitted with a sickle cell crisis. He was transfused 1 unit of PRBC yesterday. His hemoglobin has responded appropriately. We'll continue to follow and transfuse as necessary.  It is possible that his fevers and leukocytosis is secondary to his developing pneumonia. The patient is on IV antibiotics. We'll continue to follow. It is also possible that his fevers may be secondary to his acute sickle cell crisis. For now we'll  cover him with antibiotics until he continues to clinically improve. We will start him on pulmonary hygiene.  Patient also wound care Center for his chronic heel ulcer. We have asked for a wound care consultation in the hospital.  We will continue to wean his oxygen down to room air as he tolerates.  His chest pain is likely secondary to his possible underlying pneumonia.  We will repeat a CBC in the morning to trend leukocytosis. Again this may be secondary to infection versus possible demargination. He does not appear toxic at this time.    LOS: 2 days   MEMON,JEHANZEB 12/25/2010, 9:42 AM

## 2010-12-25 NOTE — Consult Note (Signed)
WOC consult Note Reason for Consult: dressing changes due over bilateral LE ulcerations Wound type: full thickness ulcers secondary to sickle cell disease. Measurement:Ulcers not visualized today as Apligraf has just been in place 1 week.  Today is date for topper dressing change. Wound bed:No visualized. Drainage (amount, consistency, odor) Thick, light beige exudate. Periwound:not visualized Dressing procedure/placement/frequency:Dressings removed down to Apligraf wound contact layer.  Topper dressing of silver hydrofiber (Aquacel Ag+) applied.  Covered with 4x4s, then secured with Kerlix from metatarsal head to patellar notch.  4-inch Coban applied over Kerlix.  Next change date is Wednesday, November 28th.  If patient is in house, please reconsult me for dressing change  If he is discharged, he is to follow up with the [facility] for dressing change and wound assessment. I will not follow.  Please re-consult if needed. Thanks, Ladona Mow, MSN, RN, [facility], CWOCN [phone]) :

## 2010-12-25 NOTE — Progress Notes (Signed)
ANTIBIOTIC CONSULT NOTE - INITIAL  Pharmacy Consult for vancomycin Indication: SIRS  No Known Allergies  Patient Measurements: Height: 5\' 6"  (167.6 cm) Weight: 132 lb 0.9 oz (59.9 kg) IBW/kg (Calculated) : 63.8  Adjusted Body Weight:   Vital Signs: Temp: 98.9 F (37.2 C) (11/21 0052) Temp src: Oral (11/21 0052) BP: 129/62 mmHg (11/21 0052) Pulse Rate: 88  (11/21 0052) Intake/Output from previous day: 11/20 0701 - 11/21 0700 In: 610 [P.O.:360; I.V.:250] Out: 1850 [Urine:1850] Intake/Output from this shift: Total I/O In: 250 [I.V.:250] Out: 400 [Urine:400]  Labs:  [facility] 12/24/10 2328 12/24/10 0354 12/23/10 1030  WBC 23.4* 16.9* 17.7*  HGB 6.7* 6.9* 8.8*  PLT 456* 382 525*  LABCREA -- -- --  CREATININE 0.60 0.61 0.51   Estimated Creatinine Clearance: 124.8 ml/min (by C-G formula based on Cr of 0.6). No results found for this basename: VANCOTROUGH:2,VANCOPEAK:2,VANCORANDOM:2,GENTTROUGH:2,GENTPEAK:2,GENTRANDOM:2,TOBRATROUGH:2,TOBRAPEAK:2,TOBRARND:2,AMIKACINPEAK:2,AMIKACINTROU:2,AMIKACIN:2, in the last 72 hours   Microbiology: No results found for this or any previous visit (from the past 720 hour(s)).  Medical History: Past Medical History  Diagnosis Date  . Sickle cell anemia   . Hypertension   . Asthma      Assessment: Pt with respiratory issues and MD order for vancomycin for SIRS. Pt is already on avelox    Goal of Therapy:  Vancomycin trough level 15-20 mcg/ml  Plan:  Measure antibiotic drug levels at steady state Follow up culture results vancomycin 1gm iv q8hr  Darlina Guys, Jacquenette Shone Crowford 12/25/2010,2:23 AM

## 2010-12-26 DIAGNOSIS — D72829 Elevated white blood cell count, unspecified: Secondary | ICD-10-CM | POA: Diagnosis present

## 2010-12-26 DIAGNOSIS — J9600 Acute respiratory failure, unspecified whether with hypoxia or hypercapnia: Secondary | ICD-10-CM | POA: Diagnosis present

## 2010-12-26 LAB — DIFFERENTIAL
Eosinophils Relative: 4 % (ref 0–5)
Lymphs Abs: 3.3 10*3/uL (ref 0.7–4.0)
Monocytes Absolute: 2.4 10*3/uL — ABNORMAL HIGH (ref 0.1–1.0)

## 2010-12-26 LAB — CBC
MCH: 29.9 pg (ref 26.0–34.0)
MCHC: 35.1 g/dL (ref 30.0–36.0)
MCV: 85.2 fL (ref 78.0–100.0)
Platelets: 417 10*3/uL — ABNORMAL HIGH (ref 150–400)
RDW: 25.3 % — ABNORMAL HIGH (ref 11.5–15.5)

## 2010-12-26 LAB — BASIC METABOLIC PANEL
BUN: 7 mg/dL (ref 6–23)
GFR calc non Af Amer: >90 mL/min (ref >90)
Glucose, Bld: 95 mg/dL (ref 70–99)
Potassium: 3.8 mEq/L (ref 3.5–5.1)

## 2010-12-26 MED ORDER — VANCOMYCIN HCL 1000 MG IV SOLR
1500.0000 mg | Freq: Three times a day (TID) | INTRAVENOUS | Status: DC
  Administered 2010-12-26 – 2010-12-27 (×4): 1500 mg via INTRAVENOUS
  Filled 2010-12-26 (×7): qty 1500

## 2010-12-26 NOTE — Progress Notes (Signed)
ANTIBIOTIC CONSULT NOTE - FOLLOW UP  Pharmacy Consult for Vancomycin Indication:  R/O pneumonia  No Known Allergies  Patient Measurements: Height: 5\' 6"  (167.6 cm) Weight: 132 lb 0.9 oz (59.9 kg) IBW/kg (Calculated) : 63.8   Vital Signs: Temp: 99.4 F (37.4 C) (11/22 1819) Temp src: Oral (11/22 1819) BP: 127/56 mmHg (11/22 1819) Pulse Rate: 111  (11/22 1819) Intake/Output from previous day: 11/21 0701 - 11/22 0700 In: 2407 [P.O.:360; I.V.:1847; IV Piggyback:200] Out: 1900 [Urine:1900] Intake/Output from this shift: Total I/O In: -  Out: 1501 [Urine:1500; Stool:1]  Labs:  [facility] 12/26/10 0430 12/25/10 0650 12/24/10 2328  WBC 22.1* 25.1* 23.4*  HGB 8.1* 9.3* 6.7*  PLT 417* 487* 456*  LABCREA -- -- --  CREATININE 0.53 0.49* 0.60   Estimated Creatinine Clearance: 124.8 ml/min (by C-G formula based on Cr of 0.53).  Basename 12/26/10 1736  VANCOTROUGH 7.2*  VANCOPEAK --  Drue Dun --  GENTTROUGH --  GENTPEAK --  GENTRANDOM --  TOBRATROUGH --  TOBRAPEAK --  TOBRARND --  AMIKACINPEAK --  AMIKACINTROU --  AMIKACIN --     Microbiology: Recent Results (from the past 720 hour(s))  CULTURE, BLOOD (ROUTINE X 2)     Status: Normal (Preliminary result)   Collection Time   12/24/10 11:20 PM      Component Value Range Status Comment   Specimen Description BLOOD RIGHT ARM   Final    Special Requests BOTTLES DRAWN AEROBIC AND ANAEROBIC 5CC   Final    Setup Time [id number]   Final    Culture     Final    Value:        BLOOD CULTURE RECEIVED NO GROWTH TO DATE CULTURE WILL BE HELD FOR 5 DAYS BEFORE ISSUING A FINAL NEGATIVE REPORT   Report Status PENDING   Incomplete   CULTURE, BLOOD (ROUTINE X 2)     Status: Normal (Preliminary result)   Collection Time   12/24/10 11:28 PM      Component Value Range Status Comment   Specimen Description BLOOD RIGHT HAND   Final    Special Requests BOTTLES DRAWN AEROBIC AND ANAEROBIC 3CC   Final    Setup Time [id number]   Final     Culture     Final    Value:        BLOOD CULTURE RECEIVED NO GROWTH TO DATE CULTURE WILL BE HELD FOR 5 DAYS BEFORE ISSUING A FINAL NEGATIVE REPORT   Report Status PENDING   Incomplete     Anti-infectives     Start     Dose/Rate Route Frequency Ordered Stop   12/25/10 1000   vancomycin (VANCOCIN) IVPB 1000 mg/200 mL premix        1,000 mg 200 mL/hr over 60 Minutes Intravenous Every 8 hours 12/25/10 0229     12/25/10 0000   vancomycin (VANCOCIN) IVPB 1000 mg/200 mL premix  Status:  Discontinued        1,000 mg 200 mL/hr over 60 Minutes Intravenous Every 8 hours 12/24/10 2304 12/25/10 0229   12/23/10 2245   moxifloxacin (AVELOX) tablet 400 mg        400 mg Oral Daily-1800 12/23/10 2212            Assessment:  20 YOM on D2 Vancomycin and Avelox for potential PNA  Sickle cell patient  Vanc trough at steady state returns as 7.2 (subtherapeutic) on Vancomycin 1gm IV q8h  1800 dose of vancomycin not given  Great renal function, blood cultures  pending, WBC still elevated.  Goal of Therapy:  Vancomycin trough level 15-20 mcg/ml  Plan:   Will increase vancomycin to 1500 mg IV q8 hours   Will obtain vancomycin trough at steady state.  Geoffry Paradise Thi 12/26/2010,6:29 PM

## 2010-12-26 NOTE — Progress Notes (Signed)
Subjective: No events overnight. Still feels a little short of breath, but improving, was coughing today Objective:  Vital signs in last 24 hours:  Filed Vitals:   12/26/10 0534 12/26/10 1013 12/26/10 1334 12/26/10 1819  BP: 112/56 153/54 149/57 127/56  Pulse: 116 126 119 111  Temp: 98.4 F (36.9 C) 99.9 F (37.7 C) 99.2 F (37.3 C) 99.4 F (37.4 C)  TempSrc: Oral Oral Oral Oral  Resp: 18 18 18 18   Height:      Weight:      SpO2: 90% 92% 94% 95%    Intake/Output from previous day:   Intake/Output Summary (Last 24 hours) at 12/26/10 1849 Last data filed at 12/26/10 1821  Gross per 24 hour  Intake   1047 ml  Output   1901 ml  Net   -854 ml    Physical Exam: General: Alert, awake, oriented x3, in no acute distress. HEENT: No bruits, no goiter. Moist mucous membranes, no scleral icterus, no conjunctival pallor. Heart: tachycardic. Lungs: decreased breath sounds at bases, bronchial sounds at left base Abdomen: Soft, nontender, nondistended, positive bowel sounds. Extremities: No clubbing cyanosis or edema,  positive pedal pulses. Neuro: Grossly intact, nonfocal.    Lab Results:  Basic Metabolic Panel:    Component Value Date/Time   NA 137 12/26/2010 0430   K 3.8 12/26/2010 0430   CL 104 12/26/2010 0430   CO2 27 12/26/2010 0430   BUN 7 12/26/2010 0430   CREATININE 0.53 12/26/2010 0430   GLUCOSE 95 12/26/2010 0430   CALCIUM 8.7 12/26/2010 0430   CBC:    Component Value Date/Time   WBC 22.1* 12/26/2010 0430   HGB 8.1* 12/26/2010 0430   HCT 23.1* 12/26/2010 0430   PLT 417* 12/26/2010 0430   MCV 85.2 12/26/2010 0430   NEUTROABS 15.5* 12/26/2010 0430   LYMPHSABS 3.3 12/26/2010 0430   MONOABS 2.4* 12/26/2010 0430   EOSABS 0.9* 12/26/2010 0430   BASOSABS 0.0 12/26/2010 0430      Lab 12/26/10 0430 12/25/10 0650 12/24/10 2328 12/24/10 0354 12/23/10 1030  WBC 22.1* 25.1* 23.4* 16.9* 17.7*  HGB 8.1* 9.3* 6.7* 6.9* 8.8*  HCT 23.1* 27.4* 19.7* 19.7* 24.8*    PLT 417* 487* 456* 382 525*  MCV 85.2 87.5 85.7 86.8 87.6  MCH 29.9 29.7 29.1 30.4 31.1  MCHC 35.1 33.9 34.0 35.0 35.5  RDW 25.3* 24.7* 27.5* 27.0* 27.7*  LYMPHSABS 3.3 2.0 2.3 -- 1.8  MONOABS 2.4* 2.0* 2.1* -- 1.6*  EOSABS 0.9* 0.3 0.7 -- 0.4  BASOSABS 0.0 0.0 0.0 -- 0.0  BANDABS -- -- -- -- --    Lab 12/26/10 0430 12/25/10 0650 12/24/10 2328 12/24/10 0354 12/23/10 1030  NA 137 137 136 136 132*  K 3.8 3.8 3.6 3.7 4.0  CL 104 103 103 102 98  CO2 27 26 26 26 24   GLUCOSE 95 94 115* 94 85  BUN 7 8 9 6 6   CREATININE 0.53 0.49* 0.60 0.61 0.51  CALCIUM 8.7 9.0 8.7 8.7 9.6  MG -- -- -- -- --   No results found for this basename: INR:5,PROTIME:5 in the last 168 hours Cardiac markers: No results found for this basename: CK:3,CKMB:3,TROPONINI:3,MYOGLOBIN:3 in the last 168 hours No results found for this basename: POCBNP:3 in the last 168 hours Recent Results (from the past 240 hour(s))  CULTURE, BLOOD (ROUTINE X 2)     Status: Normal (Preliminary result)   Collection Time   12/24/10 11:20 PM      Component Value  Range Status Comment   Specimen Description BLOOD RIGHT ARM   Final    Special Requests BOTTLES DRAWN AEROBIC AND ANAEROBIC 5CC   Final    Setup Time [id number]   Final    Culture     Final    Value:        BLOOD CULTURE RECEIVED NO GROWTH TO DATE CULTURE WILL BE HELD FOR 5 DAYS BEFORE ISSUING A FINAL NEGATIVE REPORT   Report Status PENDING   Incomplete   CULTURE, BLOOD (ROUTINE X 2)     Status: Normal (Preliminary result)   Collection Time   12/24/10 11:28 PM      Component Value Range Status Comment   Specimen Description BLOOD RIGHT HAND   Final    Special Requests BOTTLES DRAWN AEROBIC AND ANAEROBIC 3CC   Final    Setup Time [id number]   Final    Culture     Final    Value:        BLOOD CULTURE RECEIVED NO GROWTH TO DATE CULTURE WILL BE HELD FOR 5 DAYS BEFORE ISSUING A FINAL NEGATIVE REPORT   Report Status PENDING   Incomplete     Studies/Results: Dg Chest 1  View  12/24/2010  *RADIOLOGY REPORT*  Clinical Data: Chest pain, sickle cell.  CHEST - 1 VIEW  Comparison: 12/23/2010 CT  Findings: Cardiomegaly.  Right pleural effusion.  Bibasilar opacities.  No pneumothorax.  No acute osseous abnormality. Surgical clips right upper quadrant.  IMPRESSION: Interval development of a right pleural effusion and bibasilar opacities; atelectasis versus pneumonia.  Original Report Authenticated By: Waneta Martins, M.D.    Medications: Scheduled Meds:   . docusate sodium  100 mg Oral BID  . folic acid  1 mg Oral Daily  . guaiFENesin  600 mg Oral BID  . heparin  5,000 Units Subcutaneous Q8H  . ibuprofen  600 mg Oral TID  . lisinopril  10 mg Oral Daily  . moxifloxacin  400 mg Oral q1800  . multivitamins ther. w/minerals  1 tablet Oral Daily  . pantoprazole  40 mg Oral Q1200   Or  . pantoprazole (PROTONIX) IV  40 mg Intravenous Q1200  . vancomycin  1,500 mg Intravenous Q8H  . DISCONTD: vancomycin  1,000 mg Intravenous Q8H   Continuous Infusions:   . sodium chloride 100 mL/hr at 12/26/10 1710   PRN Meds:.acetaminophen, diphenhydrAMINE, HYDROmorphone, hydrOXYzine, levalbuterol, LORazepam, LORazepam, magnesium hydroxide, ondansetron (ZOFRAN) IV, ondansetron  Assessment/Plan:   Sickle cell anemia with crisis: Patient's pain has improved, not requiring a lot of pain medicine. Volume status appears to be improved as well. Turndown IV fluids  Chest pain improved  Bronchitis  Chronic heel ulcer: Was seen by wound care team with dressing changes. [facility] will [facility].  Back pain  Hypertension  PNA (pneumonia) currently on vancomycin and Avelox. His leukocytosis persists. Again this may be secondary to his sickle cell crisis. But with his fever elevated WBC count, cough, and shortness of breath, we will treat him empirically for pneumonia to complete a course of antibiotics. Patient's tachycardia is also  persisting. We will continue to monitor this. Once his hemodynamics and WBC count begin to improve he can likely be discharged home. Continue to wean down oxygen as tolerated.    LOS: 3 days   Cameron [name] 12/26/2010, 6:49 PM

## 2010-12-27 LAB — CBC
HCT: 21.3 % — ABNORMAL LOW (ref 39.0–52.0)
MCH: 29.3 pg (ref 26.0–34.0)
MCV: 85.5 fL (ref 78.0–100.0)
RDW: 26.1 % — ABNORMAL HIGH (ref 11.5–15.5)
WBC: 18.7 10*3/uL — ABNORMAL HIGH (ref 4.0–10.5)

## 2010-12-27 NOTE — Progress Notes (Signed)
Subjective: No events overnight. Patient reports that breathing is improving. He does have a cough. He does still occasionally  feels short of breath. He does have some right-sided chest pain while coughing. Objective:  Vital signs in last 24 hours:  Filed Vitals:   12/26/10 1819 12/26/10 2237 12/27/10 0610 12/27/10 1427  BP: 127/56 148/62 119/68 133/68  Pulse: 111 107 92 105  Temp: 99.4 F (37.4 C) 99.6 F (37.6 C) 98.6 F (37 C) 98.5 F (36.9 C)  TempSrc: Oral Oral Oral Oral  Resp: 18 16 18 20   Height:      Weight:      SpO2: 95% 97% 96% 92%    Intake/Output from previous day:   Intake/Output Summary (Last 24 hours) at 12/27/10 1823 Last data filed at 12/27/10 1802  Gross per 24 hour  Intake   1116 ml  Output   2051 ml  Net   -935 ml    Physical Exam: General: Alert, awake, oriented x3, in no acute distress. HEENT: No bruits, no goiter. Moist mucous membranes, no scleral icterus, no conjunctival pallor. Heart: Regular rate and rhythm, without murmurs, rubs, gallops. Lungs: Clear to auscultation bilaterally. No wheezing, no rhonchi, no rales.  Abdomen: Soft, nontender, nondistended, positive bowel sounds. Extremities: No clubbing cyanosis or edema,  positive pedal pulses. Neuro: Grossly intact, nonfocal.    Lab Results:  Basic Metabolic Panel:    Component Value Date/Time   NA 137 12/26/2010 0430   K 3.8 12/26/2010 0430   CL 104 12/26/2010 0430   CO2 27 12/26/2010 0430   BUN 7 12/26/2010 0430   CREATININE 0.53 12/26/2010 0430   GLUCOSE 95 12/26/2010 0430   CALCIUM 8.7 12/26/2010 0430   CBC:    Component Value Date/Time   WBC 18.7* 12/27/2010 0355   HGB 7.3* 12/27/2010 0355   HCT 21.3* 12/27/2010 0355   PLT 439* 12/27/2010 0355   MCV 85.5 12/27/2010 0355   NEUTROABS 15.5* 12/26/2010 0430   LYMPHSABS 3.3 12/26/2010 0430   MONOABS 2.4* 12/26/2010 0430   EOSABS 0.9* 12/26/2010 0430   BASOSABS 0.0 12/26/2010 0430      Lab 12/27/10 0355 12/26/10  0430 12/25/10 0650 12/24/10 2328 12/24/10 0354 12/23/10 1030  WBC 18.7* 22.1* 25.1* 23.4* 16.9* --  HGB 7.3* 8.1* 9.3* 6.7* 6.9* --  HCT 21.3* 23.1* 27.4* 19.7* 19.7* --  PLT 439* 417* 487* 456* 382 --  MCV 85.5 85.2 87.5 85.7 86.8 --  MCH 29.3 29.9 29.7 29.1 30.4 --  MCHC 34.3 35.1 33.9 34.0 35.0 --  RDW 26.1* 25.3* 24.7* 27.5* 27.0* --  LYMPHSABS -- 3.3 2.0 2.3 -- 1.8  MONOABS -- 2.4* 2.0* 2.1* -- 1.6*  EOSABS -- 0.9* 0.3 0.7 -- 0.4  BASOSABS -- 0.0 0.0 0.0 -- 0.0  BANDABS -- -- -- -- -- --    Lab 12/26/10 0430 12/25/10 0650 12/24/10 2328 12/24/10 0354 12/23/10 1030  NA 137 137 136 136 132*  K 3.8 3.8 3.6 3.7 4.0  CL 104 103 103 102 98  CO2 27 26 26 26 24   GLUCOSE 95 94 115* 94 85  BUN 7 8 9 6 6   CREATININE 0.53 0.49* 0.60 0.61 0.51  CALCIUM 8.7 9.0 8.7 8.7 9.6  MG -- -- -- -- --   No results found for this basename: INR:5,PROTIME:5 in the last 168 hours Cardiac markers: No results found for this basename: CK:3,CKMB:3,TROPONINI:3,MYOGLOBIN:3 in the last 168 hours No results found for this basename: POCBNP:3 in the last  168 hours Recent Results (from the past 240 hour(s))  CULTURE, BLOOD (ROUTINE X 2)     Status: Normal (Preliminary result)   Collection Time   12/24/10 11:20 PM      Component Value Range Status Comment   Specimen Description BLOOD RIGHT ARM   Final    Special Requests BOTTLES DRAWN AEROBIC AND ANAEROBIC 5CC   Final    Setup Time [id number]   Final    Culture     Final    Value:        BLOOD CULTURE RECEIVED NO GROWTH TO DATE CULTURE WILL BE HELD FOR 5 DAYS BEFORE ISSUING A FINAL NEGATIVE REPORT   Report Status PENDING   Incomplete   CULTURE, BLOOD (ROUTINE X 2)     Status: Normal (Preliminary result)   Collection Time   12/24/10 11:28 PM      Component Value Range Status Comment   Specimen Description BLOOD RIGHT HAND   Final    Special Requests BOTTLES DRAWN AEROBIC AND ANAEROBIC 3CC   Final    Setup Time [id number]   Final    Culture     Final      Value:        BLOOD CULTURE RECEIVED NO GROWTH TO DATE CULTURE WILL BE HELD FOR 5 DAYS BEFORE ISSUING A FINAL NEGATIVE REPORT   Report Status PENDING   Incomplete     Studies/Results: No results found.  Medications: Scheduled Meds:   . docusate sodium  100 mg Oral BID  . folic acid  1 mg Oral Daily  . guaiFENesin  600 mg Oral BID  . heparin  5,000 Units Subcutaneous Q8H  . ibuprofen  600 mg Oral TID  . lisinopril  10 mg Oral Daily  . moxifloxacin  400 mg Oral q1800  . multivitamins ther. w/minerals  1 tablet Oral Daily  . pantoprazole  40 mg Oral Q1200   Or  . pantoprazole (PROTONIX) IV  40 mg Intravenous Q1200  . vancomycin  1,500 mg Intravenous Q8H  . DISCONTD: vancomycin  1,000 mg Intravenous Q8H   Continuous Infusions:   . sodium chloride 50 mL/hr at 12/27/10 1400   PRN Meds:.acetaminophen, diphenhydrAMINE, HYDROmorphone, hydrOXYzine, levalbuterol, LORazepam, LORazepam, magnesium hydroxide, ondansetron (ZOFRAN) IV, ondansetron  Assessment/Plan:  Active Problems:  Sickle cell anemia with crisis  Chest pain  Bronchitis  Chronic heel ulcer  Back pain  Hypertension  PNA (pneumonia)  Leukocytosis  Acute respiratory failure  Plan:  Patient continues to improve clinically. He's not had a fever overnight. His WBC count is starting to trend down. We will continue another day of IV antibiotics. We continued to titrate down his oxygen. He is encouraged to practice pulmonary hygiene. We will stop his IV fluids today. Recheck blood work in the morning. If patient continues to improve, then anticipate discharge in the next 24-48 hours   LOS: 4 days   Hjalmer Iovino 12/27/2010, 6:23 PM

## 2010-12-28 ENCOUNTER — Inpatient Hospital Stay (HOSPITAL_COMMUNITY): Payer: Medicaid Other

## 2010-12-28 LAB — TYPE AND SCREEN: Unit division: 0

## 2010-12-28 LAB — CBC
HCT: 21.8 % — ABNORMAL LOW (ref 39.0–52.0)
Hemoglobin: 7.5 g/dL — ABNORMAL LOW (ref 13.0–17.0)
MCH: 29.3 pg (ref 26.0–34.0)
MCHC: 34.4 g/dL (ref 30.0–36.0)

## 2010-12-28 LAB — DIFFERENTIAL
Basophils Relative: 1 % (ref 0–1)
Eosinophils Absolute: 1.3 10*3/uL — ABNORMAL HIGH (ref 0.0–0.7)
Monocytes Absolute: 2.4 10*3/uL — ABNORMAL HIGH (ref 0.1–1.0)
Neutro Abs: 12.9 10*3/uL — ABNORMAL HIGH (ref 1.7–7.7)

## 2010-12-28 LAB — BASIC METABOLIC PANEL
BUN: 6 mg/dL (ref 6–23)
Chloride: 105 mEq/L (ref 96–112)
GFR calc non Af Amer: >90 mL/min (ref >90)
Glucose, Bld: 99 mg/dL (ref 70–99)
Potassium: 3.4 mEq/L — ABNORMAL LOW (ref 3.5–5.1)

## 2010-12-28 MED ORDER — VANCOMYCIN HCL 1000 MG IV SOLR
1250.0000 mg | Freq: Three times a day (TID) | INTRAVENOUS | Status: DC
  Administered 2010-12-28 – 2010-12-29 (×6): 1250 mg via INTRAVENOUS
  Filled 2010-12-28 (×10): qty 1250

## 2010-12-28 NOTE — Progress Notes (Signed)
ANTIBIOTIC CONSULT NOTE - FOLLOW UP  Pharmacy Consult for Vancomycin Indication:  R/O pneumonia  No Known Allergies  Patient Measurements: Height: 5\' 6"  (167.6 cm) Weight: 132 lb 0.9 oz (59.9 kg) IBW/kg (Calculated) : 63.8   Vital Signs: Temp: 98.9 F (37.2 C) (11/23 2345) Temp src: Oral (11/23 2345) BP: 143/67 mmHg (11/23 2345) Pulse Rate: 124  (11/23 2345) Intake/Output from previous day: 11/23 0701 - 11/24 0700 In: 1856 [P.O.:240; I.V.:1116; IV Piggyback:500] Out: 2551 [Urine:2550; Stool:1] Intake/Output from this shift: Total I/O In: 500 [IV Piggyback:500] Out: 800 [Urine:800]  Labs:  [facility] 12/27/10 0355 12/26/10 0430 12/25/10 0650  WBC 18.7* 22.1* 25.1*  HGB 7.3* 8.1* 9.3*  PLT 439* 417* 487*  LABCREA -- -- --  CREATININE -- 0.53 0.49*   Estimated Creatinine Clearance: 124.8 ml/min (by C-G formula based on Cr of 0.53).  Basename 12/28/10 0005 12/26/10 1736  VANCOTROUGH 23.0* 7.2*  VANCOPEAK -- --  Drue Dun -- --  GENTTROUGH -- --  GENTPEAK -- --  GENTRANDOM -- --  TOBRATROUGH -- --  TOBRAPEAK -- --  TOBRARND -- --  AMIKACINPEAK -- --  AMIKACINTROU -- --  AMIKACIN -- --     Microbiology: Recent Results (from the past 720 hour(s))  CULTURE, BLOOD (ROUTINE X 2)     Status: Normal (Preliminary result)   Collection Time   12/24/10 11:20 PM      Component Value Range Status Comment   Specimen Description BLOOD RIGHT ARM   Final    Special Requests BOTTLES DRAWN AEROBIC AND ANAEROBIC 5CC   Final    Setup Time [id number]   Final    Culture     Final    Value:        BLOOD CULTURE RECEIVED NO GROWTH TO DATE CULTURE WILL BE HELD FOR 5 DAYS BEFORE ISSUING A FINAL NEGATIVE REPORT   Report Status PENDING   Incomplete   CULTURE, BLOOD (ROUTINE X 2)     Status: Normal (Preliminary result)   Collection Time   12/24/10 11:28 PM      Component Value Range Status Comment   Specimen Description BLOOD RIGHT HAND   Final    Special Requests BOTTLES DRAWN  AEROBIC AND ANAEROBIC 3CC   Final    Setup Time [id number]   Final    Culture     Final    Value:        BLOOD CULTURE RECEIVED NO GROWTH TO DATE CULTURE WILL BE HELD FOR 5 DAYS BEFORE ISSUING A FINAL NEGATIVE REPORT   Report Status PENDING   Incomplete     Anti-infectives     Start     Dose/Rate Route Frequency Ordered Stop   12/26/10 1900   vancomycin (VANCOCIN) 1,500 mg in sodium chloride 0.9 % 500 mL IVPB        1,500 mg 250 mL/hr over 120 Minutes Intravenous Every 8 hours 12/26/10 1838     12/25/10 1000   vancomycin (VANCOCIN) IVPB 1000 mg/200 mL premix  Status:  Discontinued        1,000 mg 200 mL/hr over 60 Minutes Intravenous Every 8 hours 12/25/10 0229 12/26/10 1838   12/25/10 0000   vancomycin (VANCOCIN) IVPB 1000 mg/200 mL premix  Status:  Discontinued        1,000 mg 200 mL/hr over 60 Minutes Intravenous Every 8 hours 12/24/10 2304 12/25/10 0229   12/23/10 2245   moxifloxacin (AVELOX) tablet 400 mg        400 mg  Oral Daily-1800 12/23/10 2212            Assessment:  20 YOM on D2 Vancomycin and Avelox for potential PNA  Sickle cell patient  Vanc trough at steady state returns as 23  on Vancomycin 1500 mg IV q8h  0300 dose of vancomycin not given Per MD note, plan for IV ABX x 1 more day with likely discharge in the next 24-48 hrs  Goal of Therapy:  Vancomycin trough level 15-20 mcg/ml  Plan:   Will decrease vancomycin to 1250 mg IV q8 hours   Will obtain vancomycin trough at steady state if IV Vancomycin to continue > 24 hrs.  Terrilee Files Trefz 12/28/2010,3:06 AM

## 2010-12-28 NOTE — Progress Notes (Signed)
Subjective: No events overnight. Reports breathing is feeling better.  Is still coughing, but not as much.  Has some chest pain during coughing spells.  Walked to bathroom and wasn't as hard as it was previously.  Objective:  Vital signs in last 24 hours:  Filed Vitals:   12/27/10 1427 12/27/10 1750 12/27/10 2345 12/28/10 0416  BP: 133/68 139/63 143/67 115/64  Pulse: 105 95 124 109  Temp: 98.5 F (36.9 C) 98.3 F (36.8 C) 98.9 F (37.2 C) 99.4 F (37.4 C)  TempSrc: Oral Oral Oral Oral  Resp: 20 20 20 20   Height:      Weight:      SpO2: 92% 96% 85% 87%    Intake/Output from previous day:   Intake/Output Summary (Last 24 hours) at 12/28/10 1244 Last data filed at 12/28/10 0418  Gross per 24 hour  Intake   1856 ml  Output   1851 ml  Net      5 ml    Physical Exam: General: Alert, awake, oriented x3, in no acute distress. HEENT: No bruits, no goiter. Moist mucous membranes, no scleral icterus, no conjunctival pallor. Heart: Regular rate and rhythm, without murmurs, rubs, gallops. Lungs: Clear to auscultation bilaterally. No wheezing, no rhonchi, no rales.  Abdomen: Soft, nontender, nondistended, positive bowel sounds. Extremities: No clubbing cyanosis or edema,  positive pedal pulses. Neuro: Grossly intact, nonfocal.    Lab Results:  Basic Metabolic Panel:    Component Value Date/Time   NA 140 12/28/2010 0355   K 3.4* 12/28/2010 0355   CL 105 12/28/2010 0355   CO2 29 12/28/2010 0355   BUN 6 12/28/2010 0355   CREATININE 0.53 12/28/2010 0355   GLUCOSE 99 12/28/2010 0355   CALCIUM 8.3* 12/28/2010 0355   CBC:    Component Value Date/Time   WBC 21.5* 12/28/2010 0355   HGB 7.5* 12/28/2010 0355   HCT 21.8* 12/28/2010 0355   PLT 547* 12/28/2010 0355   MCV 85.2 12/28/2010 0355   NEUTROABS 12.9* 12/28/2010 0355   LYMPHSABS 4.7* 12/28/2010 0355   MONOABS 2.4* 12/28/2010 0355   EOSABS 1.3* 12/28/2010 0355   BASOSABS 0.2* 12/28/2010 0355      Lab 12/28/10  0355 12/27/10 0355 12/26/10 0430 12/25/10 0650 12/24/10 2328 12/23/10 1030  WBC 21.5* 18.7* 22.1* 25.1* 23.4* --  HGB 7.5* 7.3* 8.1* 9.3* 6.7* --  HCT 21.8* 21.3* 23.1* 27.4* 19.7* --  PLT 547* 439* 417* 487* 456* --  MCV 85.2 85.5 85.2 87.5 85.7 --  MCH 29.3 29.3 29.9 29.7 29.1 --  MCHC 34.4 34.3 35.1 33.9 34.0 --  RDW 27.2* 26.1* 25.3* 24.7* 27.5* --  LYMPHSABS 4.7* -- 3.3 2.0 2.3 1.8  MONOABS 2.4* -- 2.4* 2.0* 2.1* 1.6*  EOSABS 1.3* -- 0.9* 0.3 0.7 0.4  BASOSABS 0.2* -- 0.0 0.0 0.0 0.0  BANDABS -- -- -- -- -- --    Lab 12/28/10 0355 12/26/10 0430 12/25/10 0650 12/24/10 2328 12/24/10 0354  NA 140 137 137 136 136  K 3.4* 3.8 3.8 3.6 3.7  CL 105 104 103 103 102  CO2 29 27 26 26 26   GLUCOSE 99 95 94 115* 94  BUN 6 7 8 9 6   CREATININE 0.53 0.53 0.49* 0.60 0.61  CALCIUM 8.3* 8.7 9.0 8.7 8.7  MG -- -- -- -- --   No results found for this basename: INR:5,PROTIME:5 in the last 168 hours Cardiac markers: No results found for this basename: CK:3,CKMB:3,TROPONINI:3,MYOGLOBIN:3 in the last 168 hours No results  found for this basename: POCBNP:3 in the last 168 hours Recent Results (from the past 240 hour(s))  CULTURE, BLOOD (ROUTINE X 2)     Status: Normal (Preliminary result)   Collection Time   12/24/10 11:20 PM      Component Value Range Status Comment   Specimen Description BLOOD RIGHT ARM   Final    Special Requests BOTTLES DRAWN AEROBIC AND ANAEROBIC 5CC   Final    Setup Time [id number]   Final    Culture     Final    Value:        BLOOD CULTURE RECEIVED NO GROWTH TO DATE CULTURE WILL BE HELD FOR 5 DAYS BEFORE ISSUING A FINAL NEGATIVE REPORT   Report Status PENDING   Incomplete   CULTURE, BLOOD (ROUTINE X 2)     Status: Normal (Preliminary result)   Collection Time   12/24/10 11:28 PM      Component Value Range Status Comment   Specimen Description BLOOD RIGHT HAND   Final    Special Requests BOTTLES DRAWN AEROBIC AND ANAEROBIC 3CC   Final    Setup Time [id number]    Final    Culture     Final    Value:        BLOOD CULTURE RECEIVED NO GROWTH TO DATE CULTURE WILL BE HELD FOR 5 DAYS BEFORE ISSUING A FINAL NEGATIVE REPORT   Report Status PENDING   Incomplete     Studies/Results: No results found.  Medications: Scheduled Meds:   . docusate sodium  100 mg Oral BID  . folic acid  1 mg Oral Daily  . guaiFENesin  600 mg Oral BID  . heparin  5,000 Units Subcutaneous Q8H  . ibuprofen  600 mg Oral TID  . lisinopril  10 mg Oral Daily  . moxifloxacin  400 mg Oral q1800  . multivitamins ther. w/minerals  1 tablet Oral Daily  . pantoprazole  40 mg Oral Q1200   Or  . pantoprazole (PROTONIX) IV  40 mg Intravenous Q1200  . vancomycin  1,250 mg Intravenous Q8H  . DISCONTD: vancomycin  1,500 mg Intravenous Q8H   Continuous Infusions:   . DISCONTD: sodium chloride 50 mL/hr at 12/27/10 1400   PRN Meds:.acetaminophen, diphenhydrAMINE, HYDROmorphone, hydrOXYzine, levalbuterol, LORazepam, LORazepam, magnesium hydroxide, ondansetron (ZOFRAN) IV, ondansetron  Assessment/Plan:  Active Problems:  Sickle cell anemia with crisis  Chest pain  Bronchitis  Chronic heel ulcer  Back pain  Hypertension  PNA (pneumonia)  Leukocytosis  Acute respiratory failure  Plan: Clinically showing some improvement, continue to titrate oxygen down as tolerated.  Repeat chest xray to monitor progression. Cont antibiotics.  Leukocytosis likely stress demargination.  Patient does not appear toxic. Ambulate Possible d/c home tomorrow if continued improvement.   LOS: 5 days   Arvel Oquinn 12/28/2010, 12:44 PM

## 2010-12-29 LAB — CBC
HCT: 21 % — ABNORMAL LOW (ref 39.0–52.0)
Hemoglobin: 7 g/dL — ABNORMAL LOW (ref 13.0–17.0)
MCH: 28.6 pg (ref 26.0–34.0)
MCHC: 33.3 g/dL (ref 30.0–36.0)
MCV: 85.7 fL (ref 78.0–100.0)

## 2010-12-29 MED ORDER — POTASSIUM CHLORIDE CRYS ER 20 MEQ PO TBCR
40.0000 meq | EXTENDED_RELEASE_TABLET | Freq: Once | ORAL | Status: AC
  Administered 2010-12-29: 40 meq via ORAL
  Filled 2010-12-29: qty 2

## 2010-12-29 NOTE — Progress Notes (Signed)
Patient  ambulated around the entire unit.  Starting O2 sat 92%.  With activity patient dropped to low of 83%. Had patient stop and take slow deep breaths and patient's O2 sats would increase to 87-90%.  After activity patient's O2 sats return to 89-90%.  Placed 1L O2 and patient's O2 sats increased to 94%.

## 2010-12-29 NOTE — Progress Notes (Signed)
Subjective: No events overnight. Patient reports that he is feeling better today, did not walk because he was afraid his breathing would get worse.  Still coughing.  Pt encouraged to walk to help with his overall progression Objective:  Vital signs in last 24 hours:  Filed Vitals:   12/27/10 2345 12/28/10 0416 12/28/10 1500 12/28/10 2150  BP: 143/67 115/64 135/61 132/69  Pulse: 124 109 91 105  Temp: 98.9 F (37.2 C) 99.4 F (37.4 C) 97.9 F (36.6 C) 98.8 F (37.1 C)  TempSrc: Oral Oral  Oral  Resp: 20 20 16 18   Height:      Weight:      SpO2: 85% 87% 97% 90%    Intake/Output from previous day:   Intake/Output Summary (Last 24 hours) at 12/29/10 1234 Last data filed at 12/29/10 4098  Gross per 24 hour  Intake   1576 ml  Output    303 ml  Net   1273 ml    Physical Exam: General: Alert, awake, oriented x3, in no acute distress. HEENT: No bruits, no goiter. Moist mucous membranes, no scleral icterus, no conjunctival pallor. Heart: Regular rate and rhythm, without murmurs, rubs, gallops. Lungs: Clear to auscultation bilaterally. No wheezing, no rhonchi, no rales.  Abdomen: Soft, nontender, nondistended, positive bowel sounds. Extremities: No clubbing cyanosis or edema,  positive pedal pulses. Neuro: Grossly intact, nonfocal.    Lab Results:  Basic Metabolic Panel:    Component Value Date/Time   NA 140 12/28/2010 0355   K 3.4* 12/28/2010 0355   CL 105 12/28/2010 0355   CO2 29 12/28/2010 0355   BUN 6 12/28/2010 0355   CREATININE 0.53 12/28/2010 0355   GLUCOSE 99 12/28/2010 0355   CALCIUM 8.3* 12/28/2010 0355   CBC:    Component Value Date/Time   WBC 20.2* 12/29/2010 0400   HGB 7.0* 12/29/2010 0400   HCT 21.0* 12/29/2010 0400   PLT 535* 12/29/2010 0400   MCV 85.7 12/29/2010 0400   NEUTROABS 12.9* 12/28/2010 0355   LYMPHSABS 4.7* 12/28/2010 0355   MONOABS 2.4* 12/28/2010 0355   EOSABS 1.3* 12/28/2010 0355   BASOSABS 0.2* 12/28/2010 0355      Lab  12/29/10 0400 12/28/10 0355 12/27/10 0355 12/26/10 0430 12/25/10 0650 12/24/10 2328 12/23/10 1030  WBC 20.2* 21.5* 18.7* 22.1* 25.1* -- --  HGB 7.0* 7.5* 7.3* 8.1* 9.3* -- --  HCT 21.0* 21.8* 21.3* 23.1* 27.4* -- --  PLT 535* 547* 439* 417* 487* -- --  MCV 85.7 85.2 85.5 85.2 87.5 -- --  MCH 28.6 29.3 29.3 29.9 29.7 -- --  MCHC 33.3 34.4 34.3 35.1 33.9 -- --  RDW 28.5* 27.2* 26.1* 25.3* 24.7* -- --  LYMPHSABS -- 4.7* -- 3.3 2.0 2.3 1.8  MONOABS -- 2.4* -- 2.4* 2.0* 2.1* 1.6*  EOSABS -- 1.3* -- 0.9* 0.3 0.7 0.4  BASOSABS -- 0.2* -- 0.0 0.0 0.0 0.0  BANDABS -- -- -- -- -- -- --    Lab 12/28/10 0355 12/26/10 0430 12/25/10 0650 12/24/10 2328 12/24/10 0354  NA 140 137 137 136 136  K 3.4* 3.8 3.8 3.6 3.7  CL 105 104 103 103 102  CO2 29 27 26 26 26   GLUCOSE 99 95 94 115* 94  BUN 6 7 8 9 6   CREATININE 0.53 0.53 0.49* 0.60 0.61  CALCIUM 8.3* 8.7 9.0 8.7 8.7  MG -- -- -- -- --   No results found for this basename: INR:5,PROTIME:5 in the last 168 hours Cardiac markers: No  results found for this basename: CK:3,CKMB:3,TROPONINI:3,MYOGLOBIN:3 in the last 168 hours No results found for this basename: POCBNP:3 in the last 168 hours Recent Results (from the past 240 hour(s))  CULTURE, BLOOD (ROUTINE X 2)     Status: Normal (Preliminary result)   Collection Time   12/24/10 11:20 PM      Component Value Range Status Comment   Specimen Description BLOOD RIGHT ARM   Final    Special Requests BOTTLES DRAWN AEROBIC AND ANAEROBIC 5CC   Final    Setup Time [id number]   Final    Culture     Final    Value:        BLOOD CULTURE RECEIVED NO GROWTH TO DATE CULTURE WILL BE HELD FOR 5 DAYS BEFORE ISSUING A FINAL NEGATIVE REPORT   Report Status PENDING   Incomplete   CULTURE, BLOOD (ROUTINE X 2)     Status: Normal (Preliminary result)   Collection Time   12/24/10 11:28 PM      Component Value Range Status Comment   Specimen Description BLOOD RIGHT HAND   Final    Special Requests BOTTLES DRAWN  AEROBIC AND ANAEROBIC 3CC   Final    Setup Time [id number]   Final    Culture     Final    Value:        BLOOD CULTURE RECEIVED NO GROWTH TO DATE CULTURE WILL BE HELD FOR 5 DAYS BEFORE ISSUING A FINAL NEGATIVE REPORT   Report Status PENDING   Incomplete     Studies/Results: Dg Chest 2 View  12/28/2010  *RADIOLOGY REPORT*  Clinical Data: 20 year old male - cough and follow-up pneumonia. Sickle cell disease.  CHEST - 2 VIEW  Comparison: 12/24/2010 and prior chest radiographs  Findings: Mild cardiomegaly is again noted. Bilateral airspace opacities within the mid and lower lungs are relatively unchanged. No definite pleural effusion or pneumothorax noted. No acute or suspicious bony abnormalities are present. Cholecystectomy clips are identified.  IMPRESSION: Relatively unchanged bilateral airspace opacities which may represent pneumonia.  Mild cardiomegaly.  Original Report Authenticated By: Rosendo Gros, M.D.    Medications: Scheduled Meds:   . docusate sodium  100 mg Oral BID  . folic acid  1 mg Oral Daily  . guaiFENesin  600 mg Oral BID  . heparin  5,000 Units Subcutaneous Q8H  . ibuprofen  600 mg Oral TID  . lisinopril  10 mg Oral Daily  . moxifloxacin  400 mg Oral q1800  . multivitamins ther. w/minerals  1 tablet Oral Daily  . pantoprazole  40 mg Oral Q1200   Or  . pantoprazole (PROTONIX) IV  40 mg Intravenous Q1200  . vancomycin  1,250 mg Intravenous Q8H   Continuous Infusions:  PRN Meds:.acetaminophen, diphenhydrAMINE, HYDROmorphone, hydrOXYzine, levalbuterol, LORazepam, LORazepam, magnesium hydroxide, ondansetron (ZOFRAN) IV, ondansetron  Assessment/Plan:  Active Problems:  Sickle cell anemia with crisis  Chest pain  Bronchitis  Chronic heel ulcer  Back pain  Hypertension  PNA (pneumonia)  Leukocytosis  Acute respiratory failure  Plan:  Continue to wean down oxygen, ambulate in halls, cxr is unchanged, will need follow up chest xray in 2 weeks to ensure  clearing Hgb is 7 today, in 2011 his Hgb was in the 10s.  He is reluctant to undergo transfusion, will recheck in am and if <7, then would recommend transfusion Slow progress, hopefully home in next 24-48hrs   LOS: 6 days   Cymone Yeske 12/29/2010, 12:34 PM

## 2010-12-30 LAB — CBC
HCT: 22.1 % — ABNORMAL LOW (ref 39.0–52.0)
Platelets: 514 10*3/uL — ABNORMAL HIGH (ref 150–400)

## 2010-12-30 MED ORDER — MOXIFLOXACIN HCL 400 MG PO TABS: 400.0000 mg | ORAL_TABLET | Freq: Every day | ORAL | Status: AC

## 2010-12-30 MED ORDER — FOLIC ACID 1 MG PO TABS: 1.0000 mg | ORAL_TABLET | Freq: Every day | ORAL | Status: DC

## 2010-12-30 MED ORDER — OXYCODONE-ACETAMINOPHEN 5-325 MG PO TABS: 1.0000 | ORAL_TABLET | ORAL | Status: AC | PRN

## 2010-12-30 MED ORDER — GUAIFENESIN ER 600 MG PO TB12: 600.0000 mg | ORAL_TABLET | Freq: Two times a day (BID) | ORAL | Status: DC

## 2010-12-30 NOTE — Progress Notes (Signed)
Spoke with patient at bedside. States his PCP is in [place], [place] - [place], it has been several months since he has seen her. Uses [facility] at times. Order received for Home O2 and RN to f/u after d/c, patient has a qualifying sat. Spoke with patient and he wants [facility], contacted [facility] to arrange. Patient plans to d/c back to his dorm, [employer] at [employer]. States he uses crutches to get around, feels he will do ok with that going forward. Has plans strength training for his legs and hopefully will not need for long.

## 2010-12-30 NOTE — Discharge Summary (Signed)
Patient ID: Cameron [name] MRN: [id number] DOB/AGE: October 14, 1990 20 y.o.  Admit date: 12/23/2010 Discharge date: 12/30/2010  Primary Care Physician:  Tonye Becket, MD in [place] Sickle Cell MD:  Sammuel Cooper, MD in [place], [place]  Discharge Diagnoses:     Sickle cell anemia with crisis  Chest pain  Chronic heel ulcer  Back pain  Hypertension  PNA (pneumonia)  Leukocytosis  Acute respiratory failure   Current Discharge Medication List    START taking these medications   Details  folic acid (FOLVITE) 1 MG tablet Take 1 tablet (1 mg total) by mouth daily. Qty: 30 tablet, Refills: 1    guaiFENesin (MUCINEX) 600 MG 12 hr tablet Take 1 tablet (600 mg total) by mouth 2 (two) times daily. Qty: 14 tablet, Refills: 0    moxifloxacin (AVELOX) 400 MG tablet Take 1 tablet (400 mg total) by mouth daily at 6 PM. Qty: 3 tablet, Refills: 0    oxyCODONE-acetaminophen (ROXICET) 5-325 MG per tablet Take 1 tablet by mouth every 4 (four) hours as needed for pain. Qty: 30 tablet, Refills: 0      CONTINUE these medications which have NOT CHANGED   Details  ibuprofen (ADVIL,MOTRIN) 600 MG tablet Take 600 mg by mouth every 8 (eight) hours as needed. For pain     lisinopril (PRINIVIL,ZESTRIL) 10 MG tablet Take 10 mg by mouth daily.      Pediatric Multiple Vitamins (FLINTSTONES MULTIVITAMIN PO) Take 1 tablet by mouth daily.      hydroxyurea (HYDREA) 500 MG capsule Take 1,500 mg by mouth daily. May take with food to minimize GI side effects.         Disposition and Follow-up: follow up with primary doctor and sickle cell MD in 1-2weeks for repeat cbc and assess need for home oxygen. Follow up with wound center as scheduled.  Consults:  none  Significant Diagnostic Studies:  Dg Chest 2 View  12/23/2010  *RADIOLOGY REPORT*  Clinical Data: Chest pain and shortness of breath.  History of sickle cell.  CHEST - 2 VIEW  Comparison: None.  Findings: The heart size and mediastinal contours are  normal. There is mild diffuse central airway thickening without hyperinflation or confluent airspace opacity.  There is no pleural effusion.  The osseous structures appear unremarkable aside from a mild scoliosis which may be positional.  Cholecystectomy clips are noted.  IMPRESSION: Mild central airway thickening suggesting possible bronchitis.  No evidence of pneumonia.  Original Report Authenticated By: Gerrianne Scale, M.D.   Ct Angio Chest W/cm &/or Wo Cm  12/23/2010  *RADIOLOGY REPORT*  Clinical Data:  Chest pain and shortness of breath.  Sickle cell crisis.  CT ANGIOGRAPHY CHEST WITH CONTRAST  Technique:  Multidetector CT imaging of the chest was performed using the standard protocol during bolus administration of intravenous contrast.  Multiplanar CT image reconstructions including MIPs were obtained to evaluate the vascular anatomy.  Contrast: OMNIPAQUE IOHEXOL 300 MG/ML IV SOLN  Comparison:  Chest x-ray dated 12/23/2010  Findings:  There are no pulmonary emboli.  There is interstitial disease and volume loss in both lower lobes, right greater than left.  However, the bronchi do not appear obstructed.  No effusions.  No hilar or mediastinal adenopathy.  The heart size is normal. Mild thoracic scoliosis.  No acute osseous abnormality.  Review of the MIP images confirms the above findings.  IMPRESSION:  1.  No pulmonary emboli. 2. Volume loss and interstitial disease in both lower lobes, right greater than  left without consolidative infiltrates.  Original Report Authenticated By: Gwynn Burly, M.D.    Brief H and P: For complete details please refer to admission H and P, but in brief Cameron [name] is an 20 y.o. male who is currently in the [place] area for school and reports that he started having chest pain yesterday morning when he woke up. He reports that he was having pain in the center of his chest and having some shortness of breath and coughing. He reports that he has not had a  sickle cell crisis episode in over 5 years. Recently the patient was taken off his her drops urea because of ulcers on his ankles that were slow to heal. The patient says that he had been doing fine up until yesterday. The patient was initially seen at the emergency department in [facility] where he lives and sent home. He says that the pain medications did not help. The patient came to [facility] for school and was not able to go because of pain. He came to the [facility] long emergency department because of chronic persistent shortness of breath and chest pain. In the emergency department he was found to have a hemoglobin of 8.8 anion elevated white blood cell count. The patient was given IV fluids and IV pain medications with no significant relief in symptoms. He also had a CT angiogram of his chest that was negative. Hospital admission was requested for further treatment and management of this acute episode.   Physical Exam on Discharge:  Filed Vitals:   12/28/10 1500 12/28/10 2150 12/29/10 1401 12/30/10 0454  BP: 135/61 132/69 137/58 117/68  Pulse: 91 105 88 94  Temp: 97.9 F (36.6 C) 98.8 F (37.1 C) 98.7 F (37.1 C) 98.6 F (37 C)  TempSrc:  Oral  Oral  Resp: 16 18 16 20   Height:      Weight:      SpO2: 97% 90% 96% 92%     Intake/Output Summary (Last 24 hours) at 12/30/10 1137 Last data filed at 12/30/10 0620  Gross per 24 hour  Intake      0 ml  Output    600 ml  Net   -600 ml    General: Alert, awake, oriented x3, in no acute distress. HEENT: No bruits, no goiter. Heart: Regular rate and rhythm, without murmurs, rubs, gallops. Lungs: Clear to auscultation bilaterally. Abdomen: Soft, nontender, nondistended, positive bowel sounds. Extremities: No clubbing cyanosis or edema with positive pedal pulses. Neuro: Grossly intact, nonfocal.  CBC:    Component Value Date/Time   WBC 20.5* 12/30/2010 0325   HGB 7.1* 12/30/2010 0325   HCT 22.1* 12/30/2010 0325   PLT  514* 12/30/2010 0325   MCV 89.1 12/30/2010 0325   NEUTROABS 12.9* 12/28/2010 0355   LYMPHSABS 4.7* 12/28/2010 0355   MONOABS 2.4* 12/28/2010 0355   EOSABS 1.3* 12/28/2010 0355   BASOSABS 0.2* 12/28/2010 0355    Basic Metabolic Panel:    Component Value Date/Time   NA 140 12/28/2010 0355   K 3.4* 12/28/2010 0355   CL 105 12/28/2010 0355   CO2 29 12/28/2010 0355   BUN 6 12/28/2010 0355   CREATININE 0.53 12/28/2010 0355   GLUCOSE 99 12/28/2010 0355   CALCIUM 8.3* 12/28/2010 0355    Hospital Course:    Sickle cell anemia with crisis: Improved with IVF, oxygen, and pain control, no further pain.  Currently his hemoglobin is 7.1.  He was transfused 1 unit of PRBC during this  hospital stay.  He was offered another transfusion today, but since he is not symptomatic, he wishes to follow up with his primary doctor/sickle MD for repeat CBC to assess need for transfusion.   Chest pain, likely pleuritic due to pneumonia, improving   Chronic heel ulcer, seen by wound nurse during hospital stay and dressing changed.  Needs to follow up with wound care center    PNA (pneumonia):  Patient was febrile and required high flow oxygen.  This has been weaned down through his hospital course.  Patient has clinically improved, is no longer febrile.  He still does have some oxygen requirements at 1-2L/min, but is comfortable breathing.  He feels ready to discharge home.  We will continue a total of 10 days of abx, and send the patient home with home oxygen and home health RN.   Leukocytosis: Patient does not have any left shift, he does not appear toxic and in fact has significantly improved.  This can be followed up by his outpatient physicians.   Acute respiratory failure, Improved, now on minimal oxygen, to be titrated off as an outpatient.   Time spent on Discharge:  Signed: MEMON,JEHANZEB 12/30/2010, 11:37 AM

## 2010-12-30 NOTE — Progress Notes (Signed)
Patient discharged home.  Discharge instructions and prescriptions given.  No change from am assessment.

## 2010-12-31 LAB — CULTURE, BLOOD (ROUTINE X 2): Culture  Setup Time: 201211210503

## 2011-01-09 ENCOUNTER — Encounter (HOSPITAL_BASED_OUTPATIENT_CLINIC_OR_DEPARTMENT_OTHER): Payer: Medicaid Other | Attending: Internal Medicine

## 2011-01-09 DIAGNOSIS — X58XXXA Exposure to other specified factors, initial encounter: Secondary | ICD-10-CM | POA: Insufficient documentation

## 2011-01-09 DIAGNOSIS — D5720 Sickle-cell/Hb-C disease without crisis: Secondary | ICD-10-CM | POA: Insufficient documentation

## 2011-01-09 DIAGNOSIS — S91309A Unspecified open wound, unspecified foot, initial encounter: Secondary | ICD-10-CM | POA: Insufficient documentation

## 2011-02-06 ENCOUNTER — Encounter (HOSPITAL_BASED_OUTPATIENT_CLINIC_OR_DEPARTMENT_OTHER): Payer: Medicaid Other | Attending: Internal Medicine

## 2011-02-06 DIAGNOSIS — D571 Sickle-cell disease without crisis: Secondary | ICD-10-CM | POA: Insufficient documentation

## 2011-02-06 DIAGNOSIS — L97309 Non-pressure chronic ulcer of unspecified ankle with unspecified severity: Secondary | ICD-10-CM | POA: Insufficient documentation

## 2011-02-20 ENCOUNTER — Emergency Department (HOSPITAL_COMMUNITY)
Admission: EM | Admit: 2011-02-20 | Discharge: 2011-02-20 | Disposition: A | Discharge status: Home / Self Care | Payer: Medicaid Other | Attending: Emergency Medicine | Admitting: Emergency Medicine

## 2011-02-20 ENCOUNTER — Emergency Department (HOSPITAL_COMMUNITY): Payer: Medicaid Other

## 2011-02-20 ENCOUNTER — Encounter (HOSPITAL_COMMUNITY): Payer: Self-pay | Admitting: Emergency Medicine

## 2011-02-20 DIAGNOSIS — I10 Essential (primary) hypertension: Secondary | ICD-10-CM | POA: Insufficient documentation

## 2011-02-20 DIAGNOSIS — D5700 Hb-SS disease with crisis, unspecified: Secondary | ICD-10-CM | POA: Insufficient documentation

## 2011-02-20 DIAGNOSIS — J45909 Unspecified asthma, uncomplicated: Secondary | ICD-10-CM | POA: Insufficient documentation

## 2011-02-20 LAB — URINALYSIS, ROUTINE W REFLEX MICROSCOPIC
Glucose, UA: NEGATIVE mg/dL
Leukocytes, UA: NEGATIVE
Protein, ur: NEGATIVE mg/dL
Specific Gravity, Urine: 1.008 (ref 1.005–1.030)

## 2011-02-20 LAB — CBC
HCT: 21.4 % — ABNORMAL LOW (ref 39.0–52.0)
MCHC: 34.1 g/dL (ref 30.0–36.0)
MCV: 80.8 fL (ref 78.0–100.0)
Platelets: 651 10*3/uL — ABNORMAL HIGH (ref 150–400)
RDW: 28.1 % — ABNORMAL HIGH (ref 11.5–15.5)

## 2011-02-20 LAB — COMPREHENSIVE METABOLIC PANEL
Albumin: 4.4 g/dL (ref 3.5–5.2)
BUN: 9 mg/dL (ref 6–23)
Calcium: 9.6 mg/dL (ref 8.4–10.5)
Chloride: 99 mEq/L (ref 96–112)
Creatinine, Ser: 0.51 mg/dL (ref 0.50–1.35)
Total Bilirubin: 4 mg/dL — ABNORMAL HIGH (ref 0.3–1.2)
Total Protein: 8.6 g/dL — ABNORMAL HIGH (ref 6.0–8.3)

## 2011-02-20 LAB — RETICULOCYTES
RBC.: 2.65 MIL/uL — ABNORMAL LOW (ref 4.22–5.81)
Retic Ct Pct: 19 % — ABNORMAL HIGH (ref 0.4–3.1)

## 2011-02-20 LAB — DIFFERENTIAL
Band Neutrophils: 0 % (ref 0–10)
Blasts: 0 %
Metamyelocytes Relative: 0 %
Promyelocytes Absolute: 0 %
nRBC: 0 /100 WBC

## 2011-02-20 LAB — URINE CULTURE: Colony Count: NO GROWTH

## 2011-02-20 MED ORDER — HYDROMORPHONE HCL PF 1 MG/ML IJ SOLN
1.0000 mg | Freq: Once | INTRAMUSCULAR | Status: AC
  Administered 2011-02-20: 1 mg via INTRAVENOUS
  Filled 2011-02-20: qty 1

## 2011-02-20 MED ORDER — IBUPROFEN 800 MG PO TABS: 800.0000 mg | ORAL_TABLET | Freq: Three times a day (TID) | ORAL | Status: AC

## 2011-02-20 MED ORDER — OXYCODONE-ACETAMINOPHEN 5-325 MG PO TABS: 1.0000 | ORAL_TABLET | ORAL | Status: AC | PRN

## 2011-02-20 MED ORDER — KETOROLAC TROMETHAMINE 30 MG/ML IJ SOLN
60.0000 mg | Freq: Once | INTRAMUSCULAR | Status: AC
  Administered 2011-02-20: 60 mg via INTRAMUSCULAR
  Filled 2011-02-20: qty 2

## 2011-02-20 MED ORDER — SODIUM CHLORIDE 0.9 % IV SOLN
Freq: Once | INTRAVENOUS | Status: AC
  Administered 2011-02-20: 15:00:00 via INTRAVENOUS

## 2011-02-20 MED ORDER — SODIUM CHLORIDE 0.9 % IV BOLUS (SEPSIS)
1000.0000 mL | Freq: Once | INTRAVENOUS | Status: AC
  Administered 2011-02-20: 1000 mL via INTRAVENOUS

## 2011-02-20 MED ORDER — ONDANSETRON HCL 4 MG/2ML IJ SOLN
4.0000 mg | Freq: Once | INTRAMUSCULAR | Status: AC
  Administered 2011-02-20: 4 mg via INTRAVENOUS
  Filled 2011-02-20: qty 2

## 2011-02-20 NOTE — ED Provider Notes (Signed)
History     CSN: [id number]  Arrival date & time 02/20/11  1149   First MD Initiated Contact with Patient 02/20/11 1207      No chief complaint on file.   (Consider location/radiation/quality/duration/timing/severity/associated sxs/prior treatment) Patient is a 21 y.o. male presenting with sickle cell pain. The history is provided by the patient.  Sickle Cell Pain Crisis  This is a recurrent problem. The current episode started today. The onset was sudden. The problem occurs rarely. The problem has been gradually improving. The pain is associated with an unknown factor. The pain is moderate. Associated symptoms include back pain. Pertinent negatives include no chest pain, no abdominal pain, no nausea, no vomiting, no headaches, no rhinorrhea, no neck pain, no cough and no difficulty breathing. There is no swelling present. His past medical history does not include chronic back pain or chronic pain. There have been no frequent pain crises. There is no history of stroke. He has not been treated with chronic transfusion therapy. He has not been treated with hydroxyurea. Recently, medical care has been given at another facility.   Pt awoke this AM with pain in his chest and shortness of breath. He additionally had pain in his low back. 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 States that after using his albuterol inhaler this AM, his chest pain/SOB resolved. He has had some persistent pain to the low back. Denies pain to hips.  He is followed by a hematologist in [place]. States baseline HGB is around 8-10. He was hospitalized here in Nov 12 for sickle cell crisis, but had not had a crisis in several years prior to that. He is not currently on hydroxyurea as he apparently developed bilateral heel ulcers with this (he was seen in wound clinic for the ulcers and recently discharged).  Past Medical History    Diagnosis Date  . Sickle cell anemia   . Hypertension   . Asthma     Past Surgical History  Procedure Date  . Cholecystectomy     No family history on file.  History  Substance Use Topics  . Smoking status: Never Smoker   . Smokeless tobacco: Never Used  . Alcohol Use: No      Review of Systems  Constitutional: Negative.  Negative for fever, chills and fatigue.  HENT: Negative for rhinorrhea and neck pain.   Eyes: Negative for visual disturbance.  Respiratory: Positive for shortness of breath. Negative for cough, chest tightness and wheezing.   Cardiovascular: Negative for chest pain, palpitations and leg swelling.  Gastrointestinal: Negative for nausea, vomiting and abdominal pain.  Musculoskeletal: Positive for back pain.  Skin: Negative.   Neurological: Negative for dizziness, tremors, facial asymmetry and headaches.    Allergies  Review of patient's allergies indicates no known allergies.  Home Medications   Current Outpatient Rx  Name Route Sig Dispense Refill  . ALBUTEROL SULFATE HFA 108 (90 BASE) MCG/ACT IN AERS Inhalation Inhale 2 puffs into the lungs every 6 (six) hours as needed. For shortness of breath.    . IBUPROFEN 600 MG PO TABS Oral Take 600 mg by mouth every 8 (eight) hours as needed. For pain     . LISINOPRIL 10 MG PO TABS Oral Take 10 mg by mouth daily.      Marland Kitchen FLINTSTONES MULTIVITAMIN PO Oral Take 1 tablet by mouth daily.  BP 153/89  Pulse 110  Temp(Src) 98.5 F (36.9 C) (Oral)  Resp 20  SpO2 96%  Physical Exam  Nursing note and vitals reviewed. Constitutional: He is oriented to person, place, and time. He appears well-developed and well-nourished. No distress.  HENT:  Head: Normocephalic and atraumatic.  Eyes: Conjunctivae and EOM are normal. Pupils are equal, round, and reactive to light.  Neck: Normal range of motion.  Cardiovascular: Normal rate, regular rhythm and normal heart sounds.   Pulmonary/Chest: Effort normal and  breath sounds normal. No respiratory distress. He has no wheezes. He has no rales. He exhibits no tenderness.  Abdominal: Soft. There is no tenderness. There is no rebound and no guarding.  Musculoskeletal: Normal range of motion. He exhibits no edema and no tenderness.       Spine: No palpable stepoff, crepitus, or gross deformity appreciated. No midline tenderness. No appreciable spasm of paravertebral muscles.   Neurological: He is alert and oriented to person, place, and time.  Skin: Skin is warm and dry. No rash noted. He is not diaphoretic.  Psychiatric: He has a normal mood and affect.    ED Course  Procedures (including critical care time)  Labs Reviewed  CBC - Abnormal; Notable for the following:    WBC 20.7 (*) ADJUSTED FOR NUCLEATED RBC'S   RBC 2.65 (*)    Hemoglobin 7.3 (*)    HCT 21.4 (*)    RDW 28.1 (*)    Platelets 651 (*)    All other components within normal limits  DIFFERENTIAL - Abnormal; Notable for the following:    Neutro Abs 15.5 (*)    Monocytes Absolute 1.7 (*)    All other components within normal limits  RETICULOCYTES - Abnormal; Notable for the following:    Retic Ct Pct 19.0 (*)    RBC. 2.65 (*)    Retic Count, Manual 503.5 (*)    All other components within normal limits  COMPREHENSIVE METABOLIC PANEL - Abnormal; Notable for the following:    Sodium 133 (*)    Glucose, Bld 135 (*)    Total Protein 8.6 (*)    Total Bilirubin 4.0 (*)    All other components within normal limits  URINALYSIS, ROUTINE W REFLEX MICROSCOPIC  URINE CULTURE   Dg Chest 2 View  02/20/2011  *RADIOLOGY REPORT*  Clinical Data: Sickle cell crisis, chest pain  CHEST - 2 VIEW  Comparison: None.  Findings: Cardiomediastinal silhouette is unremarkable.  No acute infiltrate or pulmonary edema.  Mild elevation of the right hemidiaphragm.  Bilateral basilar streaky atelectasis.  No diagnostic pneumothorax. Bony thorax is unremarkable.  IMPRESSION: No acute infiltrate or pulmonary  edema.  Mild elevation of the right hemidiaphragm.  Bilateral basilar streaky atelectasis.  Original Report Authenticated By: Natasha Mead, M.D.    Imaging performed at [employer] student health as well as here reviewed.   1. Sickle cell anemia with crisis       MDM  2:37 PM Pt rechecked. States his pain improved with Dilaudid which was given around 1230 but is coming back in his lower back. He remains CP and dyspnea free.  3:01 PM Discussed with Dr. Clarene Duke. WBC elevated to 20. Will get repeat CXR here today and UA.  5:39 PM Pt's CXR, UA were unremarkable for acute findings. Discussed admission with pt given slight tachycardia, elevated WBCs. He would prefer not to be admitted. He requests to try a different pain medication.  Pt was remedicated with Toradol which he stated helped.  He continued to be free of chest pain/shortness of breath. He again declined admission. He was discharged home with rx for narcotic pain medication and instructed to push fluids for the next several days. Strict return precautions discussed.   Grant Fontana, [place] 02/20/11 2337

## 2011-02-20 NOTE — ED Notes (Signed)
Patient transported to X-ray 

## 2011-02-20 NOTE — ED Notes (Signed)
Patient is resting comfortably. 

## 2011-02-20 NOTE — ED Notes (Signed)
PA at bedside.

## 2011-02-20 NOTE — ED Notes (Signed)
Vital signs stable. 

## 2011-02-22 NOTE — ED Provider Notes (Signed)
Medical screening examination/treatment/procedure(s) were performed by non-physician practitioner and as supervising physician I was immediately available for consultation/collaboration.   Alianny Toelle M Philana Younis, DO 02/22/11 1106 

## 2011-03-13 ENCOUNTER — Encounter (HOSPITAL_BASED_OUTPATIENT_CLINIC_OR_DEPARTMENT_OTHER): Payer: Medicaid Other

## 2011-03-20 ENCOUNTER — Encounter (HOSPITAL_BASED_OUTPATIENT_CLINIC_OR_DEPARTMENT_OTHER): Payer: Medicaid Other | Attending: Internal Medicine

## 2011-03-20 DIAGNOSIS — I872 Venous insufficiency (chronic) (peripheral): Secondary | ICD-10-CM | POA: Insufficient documentation

## 2011-03-20 DIAGNOSIS — J45909 Unspecified asthma, uncomplicated: Secondary | ICD-10-CM | POA: Insufficient documentation

## 2011-03-20 DIAGNOSIS — L97409 Non-pressure chronic ulcer of unspecified heel and midfoot with unspecified severity: Secondary | ICD-10-CM | POA: Insufficient documentation

## 2011-03-20 DIAGNOSIS — Z79899 Other long term (current) drug therapy: Secondary | ICD-10-CM | POA: Insufficient documentation

## 2011-03-20 DIAGNOSIS — D571 Sickle-cell disease without crisis: Secondary | ICD-10-CM | POA: Insufficient documentation

## 2011-04-10 ENCOUNTER — Encounter (HOSPITAL_BASED_OUTPATIENT_CLINIC_OR_DEPARTMENT_OTHER): Payer: Medicaid Other | Attending: Internal Medicine

## 2011-04-10 DIAGNOSIS — L97309 Non-pressure chronic ulcer of unspecified ankle with unspecified severity: Secondary | ICD-10-CM | POA: Insufficient documentation

## 2011-04-10 DIAGNOSIS — I872 Venous insufficiency (chronic) (peripheral): Secondary | ICD-10-CM | POA: Insufficient documentation

## 2011-04-10 DIAGNOSIS — Z79899 Other long term (current) drug therapy: Secondary | ICD-10-CM | POA: Insufficient documentation

## 2011-04-10 DIAGNOSIS — D571 Sickle-cell disease without crisis: Secondary | ICD-10-CM | POA: Insufficient documentation

## 2011-04-17 ENCOUNTER — Encounter (HOSPITAL_BASED_OUTPATIENT_CLINIC_OR_DEPARTMENT_OTHER): Payer: Medicaid Other

## 2011-05-08 ENCOUNTER — Encounter (HOSPITAL_BASED_OUTPATIENT_CLINIC_OR_DEPARTMENT_OTHER): Payer: Medicaid Other | Attending: Internal Medicine

## 2011-05-08 DIAGNOSIS — X58XXXA Exposure to other specified factors, initial encounter: Secondary | ICD-10-CM | POA: Insufficient documentation

## 2011-05-08 DIAGNOSIS — S8990XA Unspecified injury of unspecified lower leg, initial encounter: Secondary | ICD-10-CM | POA: Insufficient documentation

## 2011-05-08 DIAGNOSIS — D571 Sickle-cell disease without crisis: Secondary | ICD-10-CM | POA: Insufficient documentation

## 2011-05-08 DIAGNOSIS — I872 Venous insufficiency (chronic) (peripheral): Secondary | ICD-10-CM | POA: Insufficient documentation

## 2011-05-11 ENCOUNTER — Encounter (HOSPITAL_COMMUNITY): Payer: Self-pay | Admitting: *Deleted

## 2011-05-11 ENCOUNTER — Emergency Department (HOSPITAL_COMMUNITY)
Admission: EM | Admit: 2011-05-11 | Discharge: 2011-05-11 | Disposition: A | Discharge status: Home / Self Care | Payer: Medicaid Other | Attending: Emergency Medicine | Admitting: Emergency Medicine

## 2011-05-11 DIAGNOSIS — M549 Dorsalgia, unspecified: Secondary | ICD-10-CM | POA: Insufficient documentation

## 2011-05-11 DIAGNOSIS — IMO0001 Reserved for inherently not codable concepts without codable children: Secondary | ICD-10-CM | POA: Insufficient documentation

## 2011-05-11 DIAGNOSIS — I10 Essential (primary) hypertension: Secondary | ICD-10-CM | POA: Insufficient documentation

## 2011-05-11 DIAGNOSIS — D5700 Hb-SS disease with crisis, unspecified: Secondary | ICD-10-CM | POA: Insufficient documentation

## 2011-05-11 LAB — URINALYSIS, ROUTINE W REFLEX MICROSCOPIC
Bilirubin Urine: NEGATIVE
Glucose, UA: NEGATIVE mg/dL
Hgb urine dipstick: NEGATIVE
Ketones, ur: NEGATIVE mg/dL
Protein, ur: NEGATIVE mg/dL
Urobilinogen, UA: 1 mg/dL (ref 0.0–1.0)

## 2011-05-11 LAB — CBC
HCT: 22.6 % — ABNORMAL LOW (ref 39.0–52.0)
Hemoglobin: 7.8 g/dL — ABNORMAL LOW (ref 13.0–17.0)
MCH: 29.1 pg (ref 26.0–34.0)
MCHC: 34.5 g/dL (ref 30.0–36.0)
RBC: 2.68 MIL/uL — ABNORMAL LOW (ref 4.22–5.81)

## 2011-05-11 LAB — DIFFERENTIAL
Basophils Relative: 0 % (ref 0–1)
Eosinophils Absolute: 0.2 10*3/uL (ref 0.0–0.7)
Eosinophils Relative: 1 % (ref 0–5)
Lymphocytes Relative: 17 % (ref 12–46)
Monocytes Absolute: 1.9 10*3/uL — ABNORMAL HIGH (ref 0.1–1.0)
Neutro Abs: 10.9 10*3/uL — ABNORMAL HIGH (ref 1.7–7.7)

## 2011-05-11 LAB — COMPREHENSIVE METABOLIC PANEL
AST: 33 U/L (ref 0–37)
Albumin: 4 g/dL (ref 3.5–5.2)
Alkaline Phosphatase: 75 U/L (ref 39–117)
CO2: 25 mEq/L (ref 19–32)
Chloride: 100 mEq/L (ref 96–112)
Creatinine, Ser: 0.48 mg/dL — ABNORMAL LOW (ref 0.50–1.35)
GFR calc non Af Amer: >90 mL/min (ref >90)
Potassium: 4 mEq/L (ref 3.5–5.1)
Total Bilirubin: 4 mg/dL — ABNORMAL HIGH (ref 0.3–1.2)

## 2011-05-11 LAB — RETICULOCYTES: Retic Ct Pct: 16.3 % — ABNORMAL HIGH (ref 0.4–3.1)

## 2011-05-11 MED ORDER — SODIUM CHLORIDE 0.9 % IV BOLUS (SEPSIS)
1000.0000 mL | Freq: Once | INTRAVENOUS | Status: AC
  Administered 2011-05-11: 1000 mL via INTRAVENOUS

## 2011-05-11 MED ORDER — KETOROLAC TROMETHAMINE 30 MG/ML IJ SOLN
30.0000 mg | Freq: Once | INTRAMUSCULAR | Status: AC
  Administered 2011-05-11: 30 mg via INTRAVENOUS
  Filled 2011-05-11: qty 1

## 2011-05-11 MED ORDER — MORPHINE SULFATE 4 MG/ML IJ SOLN
4.0000 mg | Freq: Once | INTRAMUSCULAR | Status: AC
  Administered 2011-05-11: 4 mg via INTRAVENOUS
  Filled 2011-05-11: qty 1

## 2011-05-11 MED ORDER — MORPHINE SULFATE 4 MG/ML IJ SOLN
INTRAMUSCULAR | Status: AC
  Administered 2011-05-11: 4 mg via INTRAVENOUS
  Filled 2011-05-11: qty 1

## 2011-05-11 MED ORDER — MORPHINE SULFATE 4 MG/ML IJ SOLN
4.0000 mg | Freq: Once | INTRAMUSCULAR | Status: AC
  Administered 2011-05-11: 4 mg via INTRAVENOUS

## 2011-05-11 NOTE — ED Notes (Signed)
Pt reports pain to right lower back and right lower back, pt reports this is where sickle cell pain crisis has presented before.  Pain started Friday around 10PM and has been ben persistent. Pt has been trying oral pain medications at home for relief- pt uses oxycodone and ibuprofen. Last medication was administration was at 1130AM.  Pt denies painful urination, increased frequency or burning, N/V/D.  Last sickle cell crisis was in February.

## 2011-05-11 NOTE — ED Provider Notes (Signed)
History     CSN: [id number]  Arrival date & time 05/11/11  1306   First MD Initiated Contact with Patient 05/11/11 [phone]      Chief Complaint  Patient presents with  . Sickle Cell Pain Crisis  . Abdominal Pain  . Back Pain    (Consider location/radiation/quality/duration/timing/severity/associated sxs/prior treatment) HPI Pt p/w R hip and R low back pain x 2 days with some relief with oxycodone and ibuprofen. Similar to prev SCC episodes. Pt is being treated by wound care clinic for med mal ulcer of R ankle. Has improved. No pain, redness, warmth. Pt denies fever, chills, abd pain, N/V, CP, SOB, cough.  Past Medical History  Diagnosis Date  . Sickle cell anemia   . Hypertension   . Asthma     Past Surgical History  Procedure Date  . Cholecystectomy     No family history on file.  History  Substance Use Topics  . Smoking status: Never Smoker   . Smokeless tobacco: Never Used  . Alcohol Use: No      Review of Systems  Constitutional: Negative for fever and chills.  Respiratory: Negative for cough, shortness of breath and wheezing.   Cardiovascular: Negative for chest pain and palpitations.  Gastrointestinal: Negative for nausea, vomiting and abdominal pain.  Genitourinary: Negative for dysuria and flank pain.  Musculoskeletal: Positive for myalgias and back pain. Negative for arthralgias.  Skin: Positive for wound. Negative for color change and rash.  Neurological: Negative for dizziness, weakness, numbness and headaches.    Allergies  Review of patient's allergies indicates no known allergies.  Home Medications   Current Outpatient Rx  Name Route Sig Dispense Refill  . ALBUTEROL SULFATE HFA 108 (90 BASE) MCG/ACT IN AERS Inhalation Inhale 2 puffs into the lungs every 6 (six) hours as needed. For shortness of breath.    Marland Kitchen HYDROXYUREA 500 MG PO CAPS Oral Take 500 mg by mouth daily. May take with food to minimize GI side effects.    Marland Kitchen LISINOPRIL 10 MG PO TABS  Oral Take 10 mg by mouth daily.      . OXYCODONE-ACETAMINOPHEN 5-325 MG PO TABS Oral Take 1 tablet by mouth every 4 (four) hours as needed. For pain      BP 124/67  Pulse 90  Temp(Src) 99.5 F (37.5 C) (Oral)  Resp 20  SpO2 100%  Physical Exam  Nursing note and vitals reviewed. Constitutional: He is oriented to person, place, and time. He appears well-developed and well-nourished. No distress.  HENT:  Head: Normocephalic and atraumatic.  Mouth/Throat: Oropharynx is clear and moist.  Eyes: EOM are normal. Pupils are equal, round, and reactive to light.  Neck: Normal range of motion. Neck supple.  Cardiovascular: Normal rate and regular rhythm.   Pulmonary/Chest: Effort normal and breath sounds normal. No respiratory distress. He has no wheezes. He has no rales.  Abdominal: Soft. Bowel sounds are normal. There is no tenderness. There is no rebound and no guarding.  Musculoskeletal: Normal range of motion. He exhibits tenderness (Mild ttp over R proximal quadricep and groin area, no lymphadenopathy or masses. No Lumbar TTP, deformity..Small med mal ulcer to R ankle. No cellulitis or drainage). He exhibits no edema.  Neurological: He is alert and oriented to person, place, and time.       5/5 motor, sensation intact  Skin: Skin is warm and dry. No rash noted. No erythema.  Psychiatric: He has a normal mood and affect. His behavior is normal.  ED Course  Procedures (including critical care time)  Labs Reviewed  CBC - Abnormal; Notable for the following:    WBC 15.7 (*)    RBC 2.68 (*)    Hemoglobin 7.8 (*)    HCT 22.6 (*)    RDW 31.2 (*)    All other components within normal limits  DIFFERENTIAL - Abnormal; Notable for the following:    Neutro Abs 10.9 (*)    Monocytes Absolute 1.9 (*)    All other components within normal limits  COMPREHENSIVE METABOLIC PANEL - Abnormal; Notable for the following:    Sodium 134 (*)    BUN 5 (*)    Creatinine, Ser 0.48 (*)    Total  Bilirubin 4.0 (*)    All other components within normal limits  RETICULOCYTES - Abnormal; Notable for the following:    Retic Ct Pct 16.3 (*) RESULTS CONFIRMED BY MANUAL DILUTION   RBC. 2.68 (*)    Retic Count, Manual 436.8 (*)    All other components within normal limits  URINALYSIS, ROUTINE W REFLEX MICROSCOPIC   No results found.   1. Sickle cell crisis       MDM  Pt states he is feeling better after IVF's and pain medications. Return for concerns        Loren Racer, MD 05/11/11 2154

## 2011-05-11 NOTE — ED Notes (Signed)
Pt to transfer to dept.

## 2011-05-11 NOTE — Discharge Instructions (Signed)
Sickle Cell Pain Crisis Sickle cell anemia requires regular medical attention by your healthcare provider and awareness about when to seek medical care. Pain is a common problem in children with sickle cell disease. This usually starts at less than 21 year of age. Pain can occur nearly anywhere in the body but most commonly occurs in the extremities, back, chest, or belly (abdomen). Pain episodes can start suddenly or may follow an illness. These attacks can appear as decreased activity, loss of appetite, change in behavior, or simply complaints of pain. DIAGNOSIS   Specialized blood and gene testing can help make this diagnosis early in the disease. Blood tests may then be done to watch blood levels.   Specialized brain scans are done when there are problems in the brain during a crisis.   Lung testing may be done later in the disease.  HOME CARE INSTRUCTIONS   Maintain good hydration. Increase you or your child's fluid intake in hot weather and during exercise.   Avoid smoking. Smoking lowers the oxygen in the blood and can cause the production of sickle-shaped cells (sickling).   Control pain. Only take over-the-counter or prescription medicines for pain, discomfort, or fever as directed by your caregiver. Do not give aspirin to children because of the association with Reye's syndrome.   Keep regular health care checks to keep a proper red blood cell (hemoglobin) level. A moderate anemia level protects against sickling crises.   You and your child should receive all the same immunizations and care as the people around you.   Mothers should breastfeed their babies if possible. Use formulas with iron added if breastfeeding is not possible. Additional iron should not be given unless there is a lack of it. People with sickle cell disease (SCD) build up iron faster than normal. Give folic acid and additional vitamins as directed.   If you or your child has been prescribed antibiotics or other  medications to prevent problems, take them as directed.   Summer camps are available for children with SCD. They may help young people deal with their disease. The camps introduce them to other children with the same problem.   Young people with SCD may become frustrated or angry at their disease. This can cause rebellion and refusal to follow medical care. Help groups or counseling may help with these problems.   Wear a medical alert bracelet. When traveling, keep medical information, caregiver's names, and the medications you or your child takes with you at all times.  SEEK IMMEDIATE MEDICAL CARE IF:   You or your child develops dizziness or fainting, numbness in or difficulty with movement of arms and legs, difficulty with speech, or is acting abnormally. This could be early signs of a stroke. Immediate treatment is necessary.   You or your child has an oral temperature above 102 F (38.9 C), not controlled by medicine.   You or your child has other signs of infection (chills, lethargy, irritability, poor eating, vomiting). The younger the child, the more you should be concerned.   With fevers, do not give medicine to lower the fever right away. This could cover up a problem that is developing. Notify your caregiver.   You or your child develops pain that is not helped with medicine.   You or your child develops shortness of breath or is coughing up pus-like or bloody sputum.   You or your child develops any problems that are new and are causing you to worry.   You or   your child develops a persistent, often uncomfortable and painful penile erection. This is called priapism. Always check young boys for this. It is often embarrassing for them and they may not bring it to your attention. This is a medical emergency and needs immediate treatment. If this is not treated it will lead to impotence.   You or your child develops a new onset of abdominal pain, especially on the left side near the  stomach area.   You or your child has any questions or has problems that are not getting better. Return immediately if you feel your child is getting worse, even if your child was seen only a short while ago.  Document Released: 10/30/2004 Document Revised: 01/09/2011 Document Reviewed: 03/21/2009 [employer] Patient Information 2012 [employer]. 

## 2011-06-05 ENCOUNTER — Encounter (HOSPITAL_BASED_OUTPATIENT_CLINIC_OR_DEPARTMENT_OTHER): Payer: Medicaid Other

## 2011-06-10 ENCOUNTER — Encounter (HOSPITAL_BASED_OUTPATIENT_CLINIC_OR_DEPARTMENT_OTHER): Payer: Medicaid Other | Attending: General Surgery

## 2011-06-10 DIAGNOSIS — X58XXXA Exposure to other specified factors, initial encounter: Secondary | ICD-10-CM | POA: Insufficient documentation

## 2011-06-10 DIAGNOSIS — S91309A Unspecified open wound, unspecified foot, initial encounter: Secondary | ICD-10-CM | POA: Insufficient documentation

## 2011-06-10 DIAGNOSIS — Z79899 Other long term (current) drug therapy: Secondary | ICD-10-CM | POA: Insufficient documentation

## 2011-06-10 DIAGNOSIS — D571 Sickle-cell disease without crisis: Secondary | ICD-10-CM | POA: Insufficient documentation

## 2011-07-08 ENCOUNTER — Encounter (HOSPITAL_BASED_OUTPATIENT_CLINIC_OR_DEPARTMENT_OTHER): Payer: Medicaid Other

## 2012-02-26 ENCOUNTER — Encounter (HOSPITAL_BASED_OUTPATIENT_CLINIC_OR_DEPARTMENT_OTHER): Payer: Self-pay | Attending: Internal Medicine

## 2012-02-26 DIAGNOSIS — I87319 Chronic venous hypertension (idiopathic) with ulcer of unspecified lower extremity: Secondary | ICD-10-CM | POA: Insufficient documentation

## 2012-02-26 DIAGNOSIS — L97909 Non-pressure chronic ulcer of unspecified part of unspecified lower leg with unspecified severity: Secondary | ICD-10-CM | POA: Insufficient documentation

## 2012-03-11 ENCOUNTER — Encounter (HOSPITAL_BASED_OUTPATIENT_CLINIC_OR_DEPARTMENT_OTHER): Payer: BC Managed Care – PPO | Attending: Internal Medicine

## 2012-03-11 DIAGNOSIS — I872 Venous insufficiency (chronic) (peripheral): Secondary | ICD-10-CM | POA: Insufficient documentation

## 2012-03-11 DIAGNOSIS — L97309 Non-pressure chronic ulcer of unspecified ankle with unspecified severity: Secondary | ICD-10-CM | POA: Insufficient documentation

## 2012-04-08 ENCOUNTER — Encounter (HOSPITAL_BASED_OUTPATIENT_CLINIC_OR_DEPARTMENT_OTHER): Payer: BC Managed Care – PPO | Attending: Internal Medicine

## 2012-04-08 DIAGNOSIS — I872 Venous insufficiency (chronic) (peripheral): Secondary | ICD-10-CM | POA: Insufficient documentation

## 2012-04-08 DIAGNOSIS — D571 Sickle-cell disease without crisis: Secondary | ICD-10-CM | POA: Insufficient documentation

## 2012-04-08 DIAGNOSIS — I10 Essential (primary) hypertension: Secondary | ICD-10-CM | POA: Insufficient documentation

## 2012-04-08 DIAGNOSIS — L97409 Non-pressure chronic ulcer of unspecified heel and midfoot with unspecified severity: Secondary | ICD-10-CM | POA: Insufficient documentation

## 2012-04-08 DIAGNOSIS — Z79899 Other long term (current) drug therapy: Secondary | ICD-10-CM | POA: Insufficient documentation

## 2012-05-06 ENCOUNTER — Encounter (HOSPITAL_BASED_OUTPATIENT_CLINIC_OR_DEPARTMENT_OTHER): Payer: BC Managed Care – PPO | Attending: Internal Medicine

## 2012-05-06 DIAGNOSIS — L97309 Non-pressure chronic ulcer of unspecified ankle with unspecified severity: Secondary | ICD-10-CM | POA: Insufficient documentation

## 2012-05-06 DIAGNOSIS — I872 Venous insufficiency (chronic) (peripheral): Secondary | ICD-10-CM | POA: Insufficient documentation

## 2012-06-03 ENCOUNTER — Encounter (HOSPITAL_BASED_OUTPATIENT_CLINIC_OR_DEPARTMENT_OTHER): Payer: BC Managed Care – PPO | Attending: Internal Medicine

## 2012-06-03 DIAGNOSIS — I872 Venous insufficiency (chronic) (peripheral): Secondary | ICD-10-CM | POA: Insufficient documentation

## 2012-06-03 DIAGNOSIS — L97309 Non-pressure chronic ulcer of unspecified ankle with unspecified severity: Secondary | ICD-10-CM | POA: Insufficient documentation

## 2012-07-08 ENCOUNTER — Encounter (HOSPITAL_BASED_OUTPATIENT_CLINIC_OR_DEPARTMENT_OTHER): Payer: BC Managed Care – PPO | Attending: Internal Medicine

## 2012-07-08 DIAGNOSIS — D571 Sickle-cell disease without crisis: Secondary | ICD-10-CM | POA: Insufficient documentation

## 2012-07-08 DIAGNOSIS — L97309 Non-pressure chronic ulcer of unspecified ankle with unspecified severity: Secondary | ICD-10-CM | POA: Insufficient documentation

## 2012-07-08 DIAGNOSIS — I872 Venous insufficiency (chronic) (peripheral): Secondary | ICD-10-CM | POA: Insufficient documentation

## 2012-07-16 ENCOUNTER — Emergency Department (HOSPITAL_COMMUNITY): Payer: BC Managed Care – PPO

## 2012-07-16 ENCOUNTER — Encounter (HOSPITAL_COMMUNITY): Payer: Self-pay

## 2012-07-16 ENCOUNTER — Inpatient Hospital Stay (HOSPITAL_COMMUNITY)
Admission: EM | Admit: 2012-07-16 | Discharge: 2012-07-27 | DRG: 541 | Disposition: A | Discharge status: Home / Self Care | Payer: BC Managed Care – PPO | Attending: Internal Medicine | Admitting: Internal Medicine

## 2012-07-16 DIAGNOSIS — M25511 Pain in right shoulder: Secondary | ICD-10-CM

## 2012-07-16 DIAGNOSIS — D72829 Elevated white blood cell count, unspecified: Secondary | ICD-10-CM

## 2012-07-16 DIAGNOSIS — D5700 Hb-SS disease with crisis, unspecified: Secondary | ICD-10-CM

## 2012-07-16 DIAGNOSIS — R079 Chest pain, unspecified: Secondary | ICD-10-CM

## 2012-07-16 DIAGNOSIS — I82409 Acute embolism and thrombosis of unspecified deep veins of unspecified lower extremity: Secondary | ICD-10-CM

## 2012-07-16 DIAGNOSIS — M62838 Other muscle spasm: Secondary | ICD-10-CM | POA: Diagnosis present

## 2012-07-16 DIAGNOSIS — M25459 Effusion, unspecified hip: Secondary | ICD-10-CM | POA: Diagnosis present

## 2012-07-16 DIAGNOSIS — D5701 Hb-SS disease with acute chest syndrome: Secondary | ICD-10-CM

## 2012-07-16 DIAGNOSIS — D571 Sickle-cell disease without crisis: Secondary | ICD-10-CM | POA: Diagnosis present

## 2012-07-16 DIAGNOSIS — M25451 Effusion, right hip: Secondary | ICD-10-CM

## 2012-07-16 DIAGNOSIS — R509 Fever, unspecified: Secondary | ICD-10-CM

## 2012-07-16 DIAGNOSIS — T82898A Other specified complication of vascular prosthetic devices, implants and grafts, initial encounter: Secondary | ICD-10-CM | POA: Diagnosis not present

## 2012-07-16 DIAGNOSIS — I82629 Acute embolism and thrombosis of deep veins of unspecified upper extremity: Secondary | ICD-10-CM | POA: Diagnosis not present

## 2012-07-16 DIAGNOSIS — M25452 Effusion, left hip: Secondary | ICD-10-CM

## 2012-07-16 DIAGNOSIS — I10 Essential (primary) hypertension: Secondary | ICD-10-CM | POA: Diagnosis present

## 2012-07-16 DIAGNOSIS — K5900 Constipation, unspecified: Secondary | ICD-10-CM | POA: Diagnosis not present

## 2012-07-16 DIAGNOSIS — I809 Phlebitis and thrombophlebitis of unspecified site: Secondary | ICD-10-CM

## 2012-07-16 DIAGNOSIS — B370 Candidal stomatitis: Secondary | ICD-10-CM

## 2012-07-16 DIAGNOSIS — L97309 Non-pressure chronic ulcer of unspecified ankle with unspecified severity: Secondary | ICD-10-CM | POA: Diagnosis present

## 2012-07-16 DIAGNOSIS — I498 Other specified cardiac arrhythmias: Secondary | ICD-10-CM | POA: Diagnosis present

## 2012-07-16 DIAGNOSIS — R0902 Hypoxemia: Secondary | ICD-10-CM

## 2012-07-16 DIAGNOSIS — I82A11 Acute embolism and thrombosis of right axillary vein: Secondary | ICD-10-CM

## 2012-07-16 DIAGNOSIS — J189 Pneumonia, unspecified organism: Principal | ICD-10-CM | POA: Diagnosis present

## 2012-07-16 DIAGNOSIS — L97421 Non-pressure chronic ulcer of left heel and midfoot limited to breakdown of skin: Secondary | ICD-10-CM

## 2012-07-16 DIAGNOSIS — Y849 Medical procedure, unspecified as the cause of abnormal reaction of the patient, or of later complication, without mention of misadventure at the time of the procedure: Secondary | ICD-10-CM | POA: Diagnosis not present

## 2012-07-16 DIAGNOSIS — M25519 Pain in unspecified shoulder: Secondary | ICD-10-CM | POA: Diagnosis present

## 2012-07-16 DIAGNOSIS — M549 Dorsalgia, unspecified: Secondary | ICD-10-CM

## 2012-07-16 DIAGNOSIS — Z113 Encounter for screening for infections with a predominantly sexual mode of transmission: Secondary | ICD-10-CM

## 2012-07-16 DIAGNOSIS — M25559 Pain in unspecified hip: Secondary | ICD-10-CM | POA: Diagnosis present

## 2012-07-16 DIAGNOSIS — R000 Tachycardia, unspecified: Secondary | ICD-10-CM

## 2012-07-16 LAB — COMPREHENSIVE METABOLIC PANEL
ALT: 17 U/L (ref 0–53)
Albumin: 4.4 g/dL (ref 3.5–5.2)
Alkaline Phosphatase: 69 U/L (ref 39–117)
BUN: 9 mg/dL (ref 6–23)
Chloride: 100 mEq/L (ref 96–112)
GFR calc Af Amer: >90 mL/min (ref >90)
Glucose, Bld: 119 mg/dL — ABNORMAL HIGH (ref 70–99)
Potassium: 5 mEq/L (ref 3.5–5.1)
Sodium: 136 mEq/L (ref 135–145)
Total Bilirubin: 5.3 mg/dL — ABNORMAL HIGH (ref 0.3–1.2)
Total Protein: 8.3 g/dL (ref 6.0–8.3)

## 2012-07-16 LAB — CBC WITH DIFFERENTIAL/PLATELET
Basophils Relative: 1 % (ref 0–1)
Eosinophils Relative: 2 % (ref 0–5)
HCT: 23 % — ABNORMAL LOW (ref 39.0–52.0)
Hemoglobin: 8.2 g/dL — ABNORMAL LOW (ref 13.0–17.0)
Lymphs Abs: 3.4 10*3/uL (ref 0.7–4.0)
MCH: 34.9 pg — ABNORMAL HIGH (ref 26.0–34.0)
MCV: 97.9 fL (ref 78.0–100.0)
Monocytes Absolute: 1.6 10*3/uL — ABNORMAL HIGH (ref 0.1–1.0)
Monocytes Relative: 12 % (ref 3–12)
Neutro Abs: 8 10*3/uL — ABNORMAL HIGH (ref 1.7–7.7)
RBC: 2.35 MIL/uL — ABNORMAL LOW (ref 4.22–5.81)
WBC: 13.4 10*3/uL — ABNORMAL HIGH (ref 4.0–10.5)

## 2012-07-16 LAB — URINALYSIS, ROUTINE W REFLEX MICROSCOPIC
Glucose, UA: NEGATIVE mg/dL
Hgb urine dipstick: NEGATIVE
Leukocytes, UA: NEGATIVE
pH: 5 (ref 5.0–8.0)

## 2012-07-16 LAB — LACTATE DEHYDROGENASE: LDH: 709 U/L — ABNORMAL HIGH (ref 94–250)

## 2012-07-16 LAB — RETICULOCYTES: Retic Ct Pct: 12.7 % — ABNORMAL HIGH (ref 0.4–3.1)

## 2012-07-16 MED ORDER — SODIUM CHLORIDE 0.9 % IJ SOLN: 9.0000 mL | INTRAMUSCULAR | Status: DC | PRN

## 2012-07-16 MED ORDER — HYDROCODONE-ACETAMINOPHEN 5-325 MG PO TABS
1.0000 | ORAL_TABLET | ORAL | Status: DC | PRN
  Administered 2012-07-16: 2 via ORAL
  Administered 2012-07-16 (×2): 1 via ORAL
  Administered 2012-07-20: 2 via ORAL
  Filled 2012-07-16 (×2): qty 1
  Filled 2012-07-16 (×2): qty 2

## 2012-07-16 MED ORDER — IOHEXOL 350 MG/ML SOLN
100.0000 mL | Freq: Once | INTRAVENOUS | Status: AC | PRN
  Administered 2012-07-16: 100 mL via INTRAVENOUS

## 2012-07-16 MED ORDER — MORPHINE SULFATE 10 MG/ML IJ SOLN
5.0000 mg | Freq: Once | INTRAMUSCULAR | Status: AC
  Administered 2012-07-16: 5 mg via INTRAVENOUS
  Filled 2012-07-16: qty 1

## 2012-07-16 MED ORDER — HYDROMORPHONE HCL PF 1 MG/ML IJ SOLN
1.0000 mg | Freq: Once | INTRAMUSCULAR | Status: AC
  Administered 2012-07-16: 1 mg via INTRAVENOUS
  Filled 2012-07-16: qty 1

## 2012-07-16 MED ORDER — MORPHINE SULFATE (PF) 1 MG/ML IV SOLN
INTRAVENOUS | Status: DC
  Administered 2012-07-16: 49.5 mg via INTRAVENOUS
  Administered 2012-07-16 (×3): via INTRAVENOUS
  Administered 2012-07-17: 13.5 mg via INTRAVENOUS
  Administered 2012-07-17: 01:00:00 via INTRAVENOUS
  Administered 2012-07-17: 15 mg via INTRAVENOUS
  Administered 2012-07-17: 9 mg via INTRAVENOUS
  Filled 2012-07-16 (×4): qty 25

## 2012-07-16 MED ORDER — FOLIC ACID 1 MG PO TABS
1.0000 mg | ORAL_TABLET | Freq: Every day | ORAL | Status: DC
  Administered 2012-07-16 – 2012-07-26 (×11): 1 mg via ORAL
  Filled 2012-07-16 (×12): qty 1

## 2012-07-16 MED ORDER — HYDROXYUREA 500 MG PO CAPS
500.0000 mg | ORAL_CAPSULE | Freq: Every day | ORAL | Status: DC
  Administered 2012-07-16 – 2012-07-18 (×3): 500 mg via ORAL
  Filled 2012-07-16 (×3): qty 1

## 2012-07-16 MED ORDER — SODIUM CHLORIDE 0.45 % IV SOLN
INTRAVENOUS | Status: DC
  Administered 2012-07-16 (×2): via INTRAVENOUS
  Administered 2012-07-17: 100 mL/h via INTRAVENOUS
  Administered 2012-07-17: 11:00:00 via INTRAVENOUS
  Administered 2012-07-19: 100 mL/h via INTRAVENOUS
  Administered 2012-07-20: 10:00:00 via INTRAVENOUS
  Administered 2012-07-21: 1000 mL via INTRAVENOUS
  Administered 2012-07-22: 10:00:00 via INTRAVENOUS
  Administered 2012-07-26: 20 mL/h via INTRAVENOUS

## 2012-07-16 MED ORDER — SENNA 8.6 MG PO TABS
1.0000 | ORAL_TABLET | Freq: Every day | ORAL | Status: DC | PRN
  Administered 2012-07-17 – 2012-07-18 (×2): 8.6 mg via ORAL
  Filled 2012-07-16 (×2): qty 1

## 2012-07-16 MED ORDER — ONDANSETRON HCL 4 MG/2ML IJ SOLN
4.0000 mg | INTRAMUSCULAR | Status: DC | PRN
  Administered 2012-07-16 – 2012-07-25 (×8): 4 mg via INTRAVENOUS
  Filled 2012-07-16 (×8): qty 2

## 2012-07-16 MED ORDER — LISINOPRIL 10 MG PO TABS
10.0000 mg | ORAL_TABLET | Freq: Every day | ORAL | Status: DC
  Administered 2012-07-16 – 2012-07-26 (×11): 10 mg via ORAL
  Filled 2012-07-16 (×12): qty 1

## 2012-07-16 MED ORDER — SODIUM CHLORIDE 0.9 % IJ SOLN: 3.0000 mL | INTRAMUSCULAR | Status: DC | PRN

## 2012-07-16 MED ORDER — SODIUM CHLORIDE 0.9 % IJ SOLN
3.0000 mL | Freq: Two times a day (BID) | INTRAMUSCULAR | Status: DC
  Administered 2012-07-17 – 2012-07-20 (×3): 3 mL via INTRAVENOUS

## 2012-07-16 MED ORDER — DEXTROSE 5 % IV SOLN
500.0000 mg | Freq: Once | INTRAVENOUS | Status: AC
  Administered 2012-07-16: 500 mg via INTRAVENOUS
  Filled 2012-07-16 (×2): qty 500

## 2012-07-16 MED ORDER — SODIUM CHLORIDE 0.9 % IV BOLUS (SEPSIS)
1000.0000 mL | Freq: Once | INTRAVENOUS | Status: AC
  Administered 2012-07-16: 1000 mL via INTRAVENOUS

## 2012-07-16 MED ORDER — DEXTROSE 5 % IV SOLN
2.0000 g | Freq: Once | INTRAVENOUS | Status: AC
  Administered 2012-07-16: 2 g via INTRAVENOUS
  Filled 2012-07-16: qty 2

## 2012-07-16 MED ORDER — POLYETHYLENE GLYCOL 3350 17 G PO PACK
17.0000 g | PACK | Freq: Every day | ORAL | Status: DC
  Administered 2012-07-16 – 2012-07-25 (×10): 17 g via ORAL
  Filled 2012-07-16 (×12): qty 1

## 2012-07-16 MED ORDER — HYDROMORPHONE HCL PF 2 MG/ML IJ SOLN
2.0000 mg | Freq: Once | INTRAMUSCULAR | Status: AC
  Administered 2012-07-16: 2 mg via INTRAVENOUS
  Filled 2012-07-16: qty 1

## 2012-07-16 MED ORDER — ONDANSETRON HCL 4 MG PO TABS: 4.0000 mg | ORAL_TABLET | ORAL | Status: DC | PRN

## 2012-07-16 MED ORDER — ONDANSETRON HCL 4 MG/2ML IJ SOLN
4.0000 mg | Freq: Once | INTRAMUSCULAR | Status: AC
  Administered 2012-07-16: 4 mg via INTRAVENOUS
  Filled 2012-07-16: qty 2

## 2012-07-16 MED ORDER — HEPARIN SODIUM (PORCINE) 5000 UNIT/ML IJ SOLN
5000.0000 [IU] | Freq: Three times a day (TID) | INTRAMUSCULAR | Status: DC
  Administered 2012-07-16 – 2012-07-18 (×6): 5000 [IU] via SUBCUTANEOUS
  Filled 2012-07-16 (×10): qty 1

## 2012-07-16 MED ORDER — KETOROLAC TROMETHAMINE 30 MG/ML IJ SOLN
15.0000 mg | Freq: Once | INTRAMUSCULAR | Status: AC
  Administered 2012-07-16: 15 mg via INTRAVENOUS
  Filled 2012-07-16: qty 1

## 2012-07-16 MED ORDER — NALOXONE HCL 0.4 MG/ML IJ SOLN: 0.4000 mg | INTRAMUSCULAR | Status: DC | PRN

## 2012-07-16 MED ORDER — ALBUTEROL SULFATE HFA 108 (90 BASE) MCG/ACT IN AERS: 2.0000 | INHALATION_SPRAY | Freq: Four times a day (QID) | RESPIRATORY_TRACT | Status: DC | PRN

## 2012-07-16 MED ORDER — SODIUM CHLORIDE 0.9 % IV SOLN: 250.0000 mL | INTRAVENOUS | Status: DC | PRN

## 2012-07-16 MED ORDER — LEVOFLOXACIN IN D5W 750 MG/150ML IV SOLN
750.0000 mg | INTRAVENOUS | Status: DC
  Administered 2012-07-16 – 2012-07-17 (×2): 750 mg via INTRAVENOUS
  Filled 2012-07-16 (×3): qty 150

## 2012-07-16 MED ORDER — KETOROLAC TROMETHAMINE 15 MG/ML IJ SOLN
15.0000 mg | Freq: Four times a day (QID) | INTRAMUSCULAR | Status: DC
  Administered 2012-07-16 – 2012-07-18 (×8): 15 mg via INTRAVENOUS
  Filled 2012-07-16 (×15): qty 1

## 2012-07-16 NOTE — ED Notes (Signed)
Patient transported to CT 

## 2012-07-16 NOTE — H&P (Signed)
[facility] History and Physical  Cameron [name] ZOX:[id number] DOB: May 15, 1990 DOA: 07/16/2012  Referring physician: Dr. Denton Lank PCP: No PCP Per Patient  Specialists: Dr. Clelia Croft in [facility] for his sickle cell management  Chief Complaint: Lower back and chest discomfort  HPI: Cameron [name] is a 22 y.o. male  With history of sickle cell with prior admission for sickle cell pain crisis manifested reportedly through back and chest discomfort.  Patient states that the pain first occurred last night.  He tried his oxycodone without much relief.  Pain has been persistent and gradually getting worse.  While in the ED given his complaint of chest discomfort patient had CT of chest which was negative for PE but did make mention patchy distribution in lungs suspicious for infiltrates.  We have been consulted for admission evaluation and recommendations for management of sickle cell pain crisis associated with possible pna.   Review of Systems: 10 point review of system reviewed and negative unless otherwise mentioned above.  Past Medical History  Diagnosis Date  . Sickle cell anemia   . Hypertension   . Asthma    Past Surgical History  Procedure Laterality Date  . Cholecystectomy     Social History:  reports that he has never smoked. He has never used smokeless tobacco. He reports that he does not drink alcohol or use illicit drugs. Patient lives at home   Can patient participate in ADLs? yes  No Known Allergies  History reviewed. No pertinent family history. None reported  Prior to Admission medications   Medication Sig Start Date End Date Taking? Authorizing Provider  albuterol (PROVENTIL HFA;VENTOLIN HFA) 108 (90 BASE) MCG/ACT inhaler Inhale 2 puffs into the lungs every 6 (six) hours as needed. For shortness of breath.   Yes Historical Provider, MD  hydroxyurea (HYDREA) 500 MG capsule Take 500 mg by mouth daily. May take with food to minimize GI side effects.   Yes Historical  Provider, MD  lisinopril (PRINIVIL,ZESTRIL) 10 MG tablet Take 10 mg by mouth daily.     Yes Historical Provider, MD  oxyCODONE (ROXICODONE) 5 MG immediate release tablet Take 5 mg by mouth every 4 (four) hours as needed for pain.   Yes Historical Provider, MD  traMADol (ULTRAM) 50 MG tablet Take 50 mg by mouth every 6 (six) hours as needed for pain.   Yes Historical Provider, MD   Physical Exam: Filed Vitals:   07/16/12 0445 07/16/12 0500 07/16/12 0634 07/16/12 0730  BP: 148/61 176/76 152/70   Pulse: 132 144 139 108  Temp: 98.5 F (36.9 C)     TempSrc: Oral     Resp: 30 22 19 13   Weight:      SpO2: 98% 94% 93% 97%     General:  Pt in NAD, Alert and Awake  Eyes: EOMI, normal exterior appearance  ENT: normal exterior appearance, MMM  Neck: supple, no goiter  Cardiovascular: RRR, no MRG  Respiratory: No wheezes, breath sounds BL, no increased work of breathing  Abdomen: soft, NT, ND  Skin: warm and dry  Musculoskeletal: no cyanosis or clubbing  Psychiatric: mood and affect appropriate  Neurologic: moves all extremities, answers questions appropriately  Labs on Admission:  Basic Metabolic Panel:  Recent Labs Lab 07/16/12 0245  NA 136  K 5.0  CL 100  CO2 26  GLUCOSE 119*  BUN 9  CREATININE 0.55  CALCIUM 8.9   Liver Function Tests:  Recent Labs Lab 07/16/12 0245  AST 53*  ALT 17  ALKPHOS  69  BILITOT 5.3*  PROT 8.3  ALBUMIN 4.4   No results found for this basename: LIPASE, AMYLASE,  in the last 168 hours No results found for this basename: AMMONIA,  in the last 168 hours CBC:  Recent Labs Lab 07/16/12 0245  WBC 13.4*  NEUTROABS 8.0*  HGB 8.2*  HCT 23.0*  MCV 97.9  PLT 219   Cardiac Enzymes: No results found for this basename: CKTOTAL, CKMB, CKMBINDEX, TROPONINI,  in the last 168 hours  BNP (last 3 results) No results found for this basename: PROBNP,  in the last 8760 hours CBG: No results found for this basename: GLUCAP,  in the last  168 hours  Radiological Exams on Admission: Dg Chest 2 View  07/16/2012   *RADIOLOGY REPORT*  Clinical Data: Severe upper back pain.  Sickle cell crisis.  CHEST - 2 VIEW  Comparison: 02/20/2011  Findings: Shallow inspiration.  The heart size and pulmonary vascularity are normal.  No focal airspace consolidation or edema. No blunting of costophrenic angles.  Soft tissue prominence along the right heart border is probably related to vascular crowding due to shallow inspiration.  No corresponding changes are demonstrated on the lateral view.  No pneumothorax.  Surgical clips in the right upper quadrant.  IMPRESSION: Shallow inspiration.  No evidence of active pulmonary disease.   Original Report Authenticated By: Burman Nieves, M.D.   Ct Angio Chest Pe W/cm &/or Wo Cm  07/16/2012   *RADIOLOGY REPORT*  Clinical Data: Chest pain and tachycardia.  Sickle cell pain. History of hypertension and asthma.  White cell count 13.4. Nonsmoker.  CT ANGIOGRAPHY CHEST  Technique:  Multidetector CT imaging of the chest using the standard protocol during bolus administration of intravenous contrast. Multiplanar reconstructed images including MIPs were obtained and reviewed to evaluate the vascular anatomy.  Contrast: OMNIPAQUE IOHEXOL 350 MG/ML SOLN  Comparison: None.  Findings: Technically adequate study with good opacification of the central and segmental pulmonary arteries.  No focal filling defects are demonstrated.  No evidence of significant pulmonary embolus.  Normal caliber thoracic aorta without evidence of dissection. Cardiac enlargement.  The esophagus is fluid-filled and distended which may suggest reflux or dysmotility.  No significant lymphadenopathy in the chest.  No pleural effusions. Diffusely scattered patchy atelectasis or infiltration in the lungs. No pneumothorax.  Airways appear patent.  Thoracic vertebrae appear intact without displacement or compression.  Visualized portions of the upper abdominal  organs demonstrate atrophic spleen with increased density.  Small cyst in the spleen. Changes are consistent with sickle cell change.  IMPRESSION: No evidence of significant pulmonary embolus.  Fluid filled mildly distended esophagus may represent reflux or dysmotility.  Patchy distribution of areas of atelectasis or infiltration in the lungs.   Original Report Authenticated By: Burman Nieves, M.D.    Assessment/Plan Active Problems:   1. Sickle cell pain crisis - Will order routine sickle cell admission orders with routine labs - schedule toradol and dilaudid - norco prn - Patient is euvolemic and therefore will not place order for fluids - Discuss with Sickle cell rounding team  2. CAP - most likely contributing to # 1 - Levaquin - supportive therapy  3. HTN - most likely exacerbated due to # 1 - continue home regimen of lisinopril - continue to monitor BP's  4. Sickle cell Anemia - Patient at baseline at 8.2 - Continue to monitor.   Code Status: full Family Communication: no family at bedside.  Disposition Plan: Pending further improvement in condition  Time spent: > 60 minutes  Penny Pia [facility] Pager [phone]  If 7PM-7AM, please contact night-coverage [email or web address] Password [facility] 07/16/2012, 9:04 AM

## 2012-07-16 NOTE — ED Notes (Signed)
Pt complains of sickle cell pain for three hours in his back and chest

## 2012-07-16 NOTE — Progress Notes (Signed)
Pt had one episode of vomiting medium amount, undigested food. Per dayshift RN pt also vomited once during the day.  IV Zofran given.  Will continue to monitor pt.

## 2012-07-16 NOTE — Progress Notes (Signed)
ANTIBIOTIC CONSULT NOTE - INITIAL  Pharmacy Consult for Levaquin Indication: CAP  No Known Allergies  Patient Measurements: Weight: 130 lb (58.968 kg)  Vital Signs: Temp: 98.5 F (36.9 C) (06/13 0445) Temp src: Oral (06/13 0445) BP: 145/96 mmHg (06/13 0930) Pulse Rate: 116 (06/13 0930)  Labs:  Recent Labs  07/16/12 0245  WBC 13.4*  HGB 8.2*  PLT 219  CREATININE 0.55    Medical History: Past Medical History  Diagnosis Date  . Sickle cell anemia   . Hypertension   . Asthma     Medications:  Anti-infectives   Start     Dose/Rate Route Frequency Ordered Stop   07/16/12 0445  cefTRIAXone (ROCEPHIN) 2 g in dextrose 5 % 50 mL IVPB     2 g 100 mL/hr over 30 Minutes Intravenous  Once 07/16/12 0436 07/16/12 0731   07/16/12 0445  azithromycin (ZITHROMAX) 500 mg in dextrose 5 % 250 mL IVPB     500 mg 250 mL/hr over 60 Minutes Intravenous  Once 07/16/12 0436 07/16/12 4098     Assessment: 22 yo M with hx of sickle cell admitted 07/16/12 with pain crisis. Plan is to start Levaquin for CAP. Patient has had a dose of ceftriaxone 2g IV x1 and azithromycin 500mg  IV x1 this morning in the ED. SCr wnl, CrCl>19ml/min.  Plan:  Levaquin 750mg  IV q24h  Darrol Angel, PharmD Pager: [phone] 07/16/2012,11:23 AM

## 2012-07-16 NOTE — ED Provider Notes (Signed)
History     CSN: [id number]  Arrival date & time 07/16/12  0133   First MD Initiated Contact with Patient 07/16/12 0330      Chief Complaint  Patient presents with  . Sickle Cell Pain Crisis    (Consider location/radiation/quality/duration/timing/severity/associated sxs/prior treatment) HPI  Giann Obara is a 22 y.o. male complaining of muscle pain exacerbation starting at 1045 tonight. States the pain in his whole back and chest, this is typical of his prior sickle cell pain crises. He denies cough, fever, shortness of breath of baseline for his prior pain crises, nausea vomiting, change in bowel or bladder habits. Patient has history of acute chest syndrome, states that this does not feel the same as prior.   PCP in [place]  Past Medical History  Diagnosis Date  . Sickle cell anemia   . Hypertension   . Asthma     Past Surgical History  Procedure Laterality Date  . Cholecystectomy      History reviewed. No pertinent family history.  History  Substance Use Topics  . Smoking status: Never Smoker   . Smokeless tobacco: Never Used  . Alcohol Use: No      Review of Systems  Constitutional: Negative for fever.  Respiratory: Negative for shortness of breath.   Cardiovascular: Negative for chest pain.  Gastrointestinal: Negative for nausea, vomiting, abdominal pain and diarrhea.  Musculoskeletal: Positive for myalgias.  All other systems reviewed and are negative.    Allergies  Review of patient's allergies indicates no known allergies.  Home Medications   Current Outpatient Rx  Name  Route  Sig  Dispense  Refill  . albuterol (PROVENTIL HFA;VENTOLIN HFA) 108 (90 BASE) MCG/ACT inhaler   Inhalation   Inhale 2 puffs into the lungs every 6 (six) hours as needed. For shortness of breath.         . hydroxyurea (HYDREA) 500 MG capsule   Oral   Take 500 mg by mouth daily. May take with food to minimize GI side effects.         Marland Kitchen lisinopril (PRINIVIL,ZESTRIL)  10 MG tablet   Oral   Take 10 mg by mouth daily.           Marland Kitchen oxyCODONE (ROXICODONE) 5 MG immediate release tablet   Oral   Take 5 mg by mouth every 4 (four) hours as needed for pain.         . traMADol (ULTRAM) 50 MG tablet   Oral   Take 50 mg by mouth every 6 (six) hours as needed for pain.           BP 160/92  Pulse 144  Temp(Src) 98.8 F (37.1 C) (Oral)  Resp 20  Wt 130 lb (58.968 kg)  BMI 20.99 kg/m2  SpO2 97%  Physical Exam  Nursing note and vitals reviewed. Constitutional: He is oriented to person, place, and time. He appears well-developed and well-nourished. No distress.  She appears acutely agitated, in pain, crying  HENT:  Head: Normocephalic.  Mouth/Throat: Oropharynx is clear and moist.  Eyes: Conjunctivae and EOM are normal. Pupils are equal, round, and reactive to light.  Neck: Normal range of motion.  Cardiovascular: Regular rhythm, normal heart sounds and intact distal pulses.   Tachycardia in the 140s to 150s  Pulmonary/Chest: Effort normal and breath sounds normal. No stridor. No respiratory distress. He has no wheezes. He has no rales. He exhibits no tenderness.  Abdominal: Soft. There is no tenderness.  Musculoskeletal: Normal range of  motion. He exhibits no edema.  Neurological: He is alert and oriented to person, place, and time.  Psychiatric: He has a normal mood and affect.    ED Course  Procedures (including critical care time)  Labs Reviewed  CBC WITH DIFFERENTIAL - Abnormal; Notable for the following:    WBC 13.4 (*)    RBC 2.35 (*)    Hemoglobin 8.2 (*)    HCT 23.0 (*)    MCH 34.9 (*)    RDW 23.9 (*)    Neutro Abs 8.0 (*)    Monocytes Absolute 1.6 (*)    All other components within normal limits  COMPREHENSIVE METABOLIC PANEL - Abnormal; Notable for the following:    Glucose, Bld 119 (*)    AST 53 (*)    Total Bilirubin 5.3 (*)    All other components within normal limits  RETICULOCYTES - Abnormal; Notable for the  following:    Retic Ct Pct 12.7 (*)    RBC. 2.35 (*)    Retic Count, Manual 298.5 (*)    All other components within normal limits  URINALYSIS, ROUTINE W REFLEX MICROSCOPIC   Dg Chest 2 View  07/16/2012   *RADIOLOGY REPORT*  Clinical Data: Severe upper back pain.  Sickle cell crisis.  CHEST - 2 VIEW  Comparison: 02/20/2011  Findings: Shallow inspiration.  The heart size and pulmonary vascularity are normal.  No focal airspace consolidation or edema. No blunting of costophrenic angles.  Soft tissue prominence along the right heart border is probably related to vascular crowding due to shallow inspiration.  No corresponding changes are demonstrated on the lateral view.  No pneumothorax.  Surgical clips in the right upper quadrant.  IMPRESSION: Shallow inspiration.  No evidence of active pulmonary disease.   Original Report Authenticated By: Burman Nieves, M.D.   6:40 AM she states that his pain is still 8/10.  1. Acute chest pain   2. Leukocytosis   3. Tachycardia   4. Hypoxia   5. Sickle cell anemia with pain       MDM   Filed Vitals:   07/16/12 0140  BP: 160/92  Pulse: 144  Temp: 98.8 F (37.1 C)  TempSrc: Oral  Resp: 20  Weight: 130 lb (58.968 kg)  SpO2: 97%     Quan Cybulski is a 22 y.o. male chest and back pain typical of prior sickle cell pain crises. Patient is significantly tachycardic in the 140s and 150s. He is hypoxic in the upper ED's. Bloodwork shows a leukocytosis with a left shift, total bili is elevated at 5 point he and his reticulocyte percentage is 2.7. Patient is not more anemic than normal. Chest x-ray is negative. Because of his concerning clinical pictures and will start him on IV antibiotics in order CT angiogram chest.   CT shows no abnormalities fluid-filled mildly distended esophagus and patchy distribution of atelectasis versus infiltration in the lungs. Patient saturation rate returns to 97% after supplementation with 2 L via nasal cannula. Patient  remains tachycardic in the upper 130s.  Medications  cefTRIAXone (ROCEPHIN) 2 g in dextrose 5 % 50 mL IVPB (2 g Intravenous New Bag/Given 07/16/12 0627)  HYDROmorphone (DILAUDID) injection 2 mg (not administered)  ketorolac (TORADOL) 30 MG/ML injection 15 mg (not administered)  HYDROmorphone (DILAUDID) injection 1 mg (1 mg Intravenous Given 07/16/12 0347)  ondansetron (ZOFRAN) injection 4 mg (4 mg Intravenous Given 07/16/12 0347)  sodium chloride 0.9 % bolus 1,000 mL (1,000 mLs Intravenous New Bag/Given 07/16/12 0357)  HYDROmorphone (  DILAUDID) injection 2 mg (2 mg Intravenous Given 07/16/12 0438)  azithromycin (ZITHROMAX) 500 mg in dextrose 5 % 250 mL IVPB (0 mg Intravenous Stopped 07/16/12 0625)  iohexol (OMNIPAQUE) 350 MG/ML injection 100 mL (100 mLs Intravenous Contrast Given 07/16/12 0552)      Wynetta Emery, PA-C 07/17/12 1144

## 2012-07-16 NOTE — ED Notes (Signed)
Pt given a urinal.  Unable to provide a sample at this time.

## 2012-07-16 NOTE — Progress Notes (Signed)
[id number]/Bernon Arviso, RN, BSN, CCM:                                                                  CHART REVIEWED AND UPDATED.  Next chart review due on [id number]. NO DISCHARGE NEEDS PRESENT AT THIS TIME. CASE MANAGEMENT [phone] 

## 2012-07-17 DIAGNOSIS — R079 Chest pain, unspecified: Secondary | ICD-10-CM

## 2012-07-17 LAB — COMPREHENSIVE METABOLIC PANEL
ALT: 33 U/L (ref 0–53)
AST: 88 U/L — ABNORMAL HIGH (ref 0–37)
Alkaline Phosphatase: 106 U/L (ref 39–117)
Calcium: 8.9 mg/dL (ref 8.4–10.5)
Glucose, Bld: 109 mg/dL — ABNORMAL HIGH (ref 70–99)
Potassium: 4 mEq/L (ref 3.5–5.1)
Sodium: 131 mEq/L — ABNORMAL LOW (ref 135–145)
Total Protein: 7.4 g/dL (ref 6.0–8.3)

## 2012-07-17 MED ORDER — ZOLPIDEM TARTRATE 5 MG PO TABS: 5.0000 mg | ORAL_TABLET | Freq: Once | ORAL | Status: DC

## 2012-07-17 MED ORDER — MORPHINE SULFATE ER 15 MG PO TBCR
15.0000 mg | EXTENDED_RELEASE_TABLET | Freq: Two times a day (BID) | ORAL | Status: DC
  Administered 2012-07-17 – 2012-07-22 (×11): 15 mg via ORAL
  Filled 2012-07-17 (×11): qty 1

## 2012-07-17 MED ORDER — HYDROMORPHONE HCL PF 1 MG/ML IJ SOLN
1.0000 mg | INTRAMUSCULAR | Status: DC | PRN
  Administered 2012-07-17 – 2012-07-18 (×8): 1 mg via INTRAVENOUS
  Filled 2012-07-17 (×9): qty 1

## 2012-07-17 MED ORDER — LORAZEPAM 1 MG PO TABS
1.0000 mg | ORAL_TABLET | Freq: Once | ORAL | Status: AC
  Administered 2012-07-17: 1 mg via ORAL
  Filled 2012-07-17: qty 1

## 2012-07-17 MED ORDER — HYDROMORPHONE HCL PF 2 MG/ML IJ SOLN
2.0000 mg | Freq: Once | INTRAMUSCULAR | Status: AC
  Administered 2012-07-17: 2 mg via INTRAVENOUS
  Filled 2012-07-17: qty 1

## 2012-07-17 NOTE — Progress Notes (Addendum)
Subjective: Patient seen, states much improved pain - which he does not attribute to PCA pump. He was on a morphine reduced dose PCA pump overnight, his pain was not controlled. This AM he received 2mg  IV Dilaudid and that helped his pain significantly. States pain is in back and chest - ranked as 6/10, was 10/10 at admission. Baseline pain 0/10. 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 His hydroxyurea was decreased from 1500mg  daily to 1000mg  daily May 18th - he believes this may be precipitating this crisis. At home he uses narcotics seldom and only when needed, so he is fairly opoid naive. He denies any fevers, chills, nausea, vomiting, diarrhea or BOU.  Objective: Weight change: 0 kg (0 lb)  Intake/Output Summary (Last 24 hours) at 07/17/12 0834 Last data filed at 07/17/12 0108  Gross per 24 hour  Intake 1588.33 ml  Output    670 ml  Net 918.33 ml   BP 151/66  Pulse 139  Temp(Src) 99.4 F (37.4 C) (Oral)  Resp 27  Ht 5\' 5"  (1.651 m)  Wt 58.968 kg (130 lb)  BMI 21.63 kg/m2  SpO2 91%  General Appearance:    Alert, cooperative, no distress, appears stated age  Head:    Normocephalic, without obvious abnormality, atraumatic  Eyes:    PERRL, conjunctiva with mild jaundice,      Nose:   Nares normal, septum midline, mucosa normal, no drainage    or sinus tenderness  Throat:   Lips, mucosa, and tongue normal; teeth and gums normal  Neck:   Supple, symmetrical, trachea midline, no adenopathy;       thyroid:  No enlargement/tenderness/nodules; no carotid   bruit or JVD  Back:     Symmetric, no curvature, ROM normal, no CVA tenderness  Lungs:     Clear to auscultation bilaterally, respirations unlabored  Chest wall:    No tenderness or deformity  Heart:    Regular rate and rhythm, S1 and S2 normal, no murmur, rub   or gallop  Abdomen:     Soft, non-tender, bowel sounds active all four quadrants,    no masses, no organomegaly        Extremities:   Extremities normal,  atraumatic, no cyanosis or edema  Pulses:   2+ and symmetric all extremities  Skin:   Skin color, texture, turgor normal, no rashes or lesions  Lymph nodes:   Cervical, supraclavicular, and axillary nodes normal  Neurologic:   CNII-XII intact. Normal strength, sensation and reflexes      throughout    Lab Results:  Recent Labs  07/16/12 0245 07/17/12 0510  NA 136 131*  K 5.0 4.0  CL 100 97  CO2 26 26  GLUCOSE 119* 109*  BUN 9 12  CREATININE 0.55 0.67  CALCIUM 8.9 8.9  MG 1.9  --     Recent Labs  07/16/12 0245 07/17/12 0510  AST 53* 88*  ALT 17 33  ALKPHOS 69 106  BILITOT 5.3* 5.2*  PROT 8.3 7.4  ALBUMIN 4.4 3.8   No results found for this basename: LIPASE, AMYLASE,  in the last 72 hours  Recent Labs  07/16/12 0245  WBC 13.4*  NEUTROABS 8.0*  HGB 8.2*  HCT 23.0*  MCV 97.9  PLT 219   No results found for this basename: CKTOTAL, CKMB, CKMBINDEX, TROPONINI,  in the last 72 hours No components found with this basename: POCBNP,  No results found for this basename: DDIMER,  in the last 72 hours  No results found for this basename: HGBA1C,  in the last 72 hours No results found for this basename: CHOL, HDL, LDLCALC, TRIG, CHOLHDL, LDLDIRECT,  in the last 72 hours No results found for this basename: TSH, T4TOTAL, FREET3, T3FREE, THYROIDAB,  in the last 72 hours  Recent Labs  07/16/12 0245  RETICCTPCT 12.7*    Micro Results: No results found for this or any previous visit (from the past 240 hour(s)).  Studies/Results: Dg Chest 2 View  07/16/2012   *RADIOLOGY REPORT*  Clinical Data: Severe upper back pain.  Sickle cell crisis.  CHEST - 2 VIEW  Comparison: 02/20/2011  Findings: Shallow inspiration.  The heart size and pulmonary vascularity are normal.  No focal airspace consolidation or edema. No blunting of costophrenic angles.  Soft tissue prominence along the right heart border is probably related to vascular crowding due to shallow inspiration.  No  corresponding changes are demonstrated on the lateral view.  No pneumothorax.  Surgical clips in the right upper quadrant.  IMPRESSION: Shallow inspiration.  No evidence of active pulmonary disease.   Original Report Authenticated By: Burman Nieves, M.D.   Ct Angio Chest Pe W/cm &/or Wo Cm  07/16/2012   *RADIOLOGY REPORT*  Clinical Data: Chest pain and tachycardia.  Sickle cell pain. History of hypertension and asthma.  White cell count 13.4. Nonsmoker.  CT ANGIOGRAPHY CHEST  Technique:  Multidetector CT imaging of the chest using the standard protocol during bolus administration of intravenous contrast. Multiplanar reconstructed images including MIPs were obtained and reviewed to evaluate the vascular anatomy.  Contrast: OMNIPAQUE IOHEXOL 350 MG/ML SOLN  Comparison: None.  Findings: Technically adequate study with good opacification of the central and segmental pulmonary arteries.  No focal filling defects are demonstrated.  No evidence of significant pulmonary embolus.  Normal caliber thoracic aorta without evidence of dissection. Cardiac enlargement.  The esophagus is fluid-filled and distended which may suggest reflux or dysmotility.  No significant lymphadenopathy in the chest.  No pleural effusions. Diffusely scattered patchy atelectasis or infiltration in the lungs. No pneumothorax.  Airways appear patent.  Thoracic vertebrae appear intact without displacement or compression.  Visualized portions of the upper abdominal organs demonstrate atrophic spleen with increased density.  Small cyst in the spleen. Changes are consistent with sickle cell change.  IMPRESSION: No evidence of significant pulmonary embolus.  Fluid filled mildly distended esophagus may represent reflux or dysmotility.  Patchy distribution of areas of atelectasis or infiltration in the lungs.   Original Report Authenticated By: Burman Nieves, M.D.   Medications: Scheduled Meds: . folic acid  1 mg Oral Daily  . heparin  5,000  Units Subcutaneous Q8H  . hydroxyurea  500 mg Oral Daily  . ketorolac  15 mg Intravenous Q6H  . levofloxacin (LEVAQUIN) IV  750 mg Intravenous Q24H  . lisinopril  10 mg Oral Daily  . morphine   Intravenous Q4H  . polyethylene glycol  17 g Oral Daily  . sodium chloride  3 mL Intravenous Q12H   Continuous Infusions: . sodium chloride 100 mL/hr at 07/16/12 2312   PRN Meds:.sodium chloride, albuterol, HYDROcodone-acetaminophen, naloxone, ondansetron (ZOFRAN) IV, ondansetron, senna, sodium chloride, sodium chloride  Assessment/Plan: @PROBHOSP @  LOS: 1 day   Acute VOC: Will d/c PCA pump and order PRN Dilaudid 1mg  IV PRN dose, nursing to call if patient tolerates and needs higher dosing for adequate pain control. Will order low dose of scheduled MS contin 15mg  BiD. Careful for oversedation as patient fairly opioid naive. Continue  toradol and K pad. Patient with evidence of acute hemolysis, elevated LDH and reticulocyte count. Will continue hydroxyurea and hydration. Plan discussed with patient and nursing.  Sickle cell anemia: hemoglobin at baseline, continue to monitor  Community acquired pneumonia: Continue antibiotics, patient with low grade fever overnight. Patient also tachycardic and tachypnea - may have also been related to inadequate pain control. Continue to monitor.  Left ankle ulcer: almost heeled. Nursing to change dressing.   Code Status: Full Code Disposition: Home  Expected discharge: next 24-48 hours  Cameron [name] 07/17/2012, 8:34 AM

## 2012-07-17 NOTE — ED Provider Notes (Signed)
Medical screening examination/treatment/procedure(s) were performed by non-physician practitioner and as supervising physician I was immediately available for consultation/collaboration.  Sunnie Nielsen, MD 07/17/12 [phone]

## 2012-07-18 ENCOUNTER — Inpatient Hospital Stay (HOSPITAL_COMMUNITY): Payer: BC Managed Care – PPO

## 2012-07-18 LAB — URINALYSIS, ROUTINE W REFLEX MICROSCOPIC
Bilirubin Urine: NEGATIVE
Hgb urine dipstick: NEGATIVE
Ketones, ur: NEGATIVE mg/dL
Protein, ur: NEGATIVE mg/dL
Urobilinogen, UA: 1 mg/dL (ref 0.0–1.0)

## 2012-07-18 LAB — LACTATE DEHYDROGENASE: LDH: 1215 U/L — ABNORMAL HIGH (ref 94–250)

## 2012-07-18 LAB — CBC
Hemoglobin: 6.2 g/dL — CL (ref 13.0–17.0)
MCH: 35.6 pg — ABNORMAL HIGH (ref 26.0–34.0)
Platelets: 93 10*3/uL — ABNORMAL LOW (ref 150–400)
Platelets: 97 10*3/uL — ABNORMAL LOW (ref 150–400)
RBC: 1.8 MIL/uL — ABNORMAL LOW (ref 4.22–5.81)
RBC: 1.84 MIL/uL — ABNORMAL LOW (ref 4.22–5.81)
RDW: 24.8 % — ABNORMAL HIGH (ref 11.5–15.5)
WBC: 18.5 10*3/uL — ABNORMAL HIGH (ref 4.0–10.5)
WBC: 19.1 10*3/uL — ABNORMAL HIGH (ref 4.0–10.5)

## 2012-07-18 LAB — COMPREHENSIVE METABOLIC PANEL
BUN: 10 mg/dL (ref 6–23)
CO2: 27 mEq/L (ref 19–32)
Calcium: 8.9 mg/dL (ref 8.4–10.5)
Chloride: 100 mEq/L (ref 96–112)
Creatinine, Ser: 0.6 mg/dL (ref 0.50–1.35)
GFR calc Af Amer: >90 mL/min (ref >90)
GFR calc non Af Amer: >90 mL/min (ref >90)
Glucose, Bld: 108 mg/dL — ABNORMAL HIGH (ref 70–99)
Total Bilirubin: 4.7 mg/dL — ABNORMAL HIGH (ref 0.3–1.2)

## 2012-07-18 LAB — HEMOGLOBIN AND HEMATOCRIT, BLOOD
HCT: 20.2 % — ABNORMAL LOW (ref 39.0–52.0)
Hemoglobin: 7.3 g/dL — ABNORMAL LOW (ref 13.0–17.0)

## 2012-07-18 LAB — EXPECTORATED SPUTUM ASSESSMENT W GRAM STAIN, RFLX TO RESP C

## 2012-07-18 LAB — PREPARE RBC (CROSSMATCH)

## 2012-07-18 LAB — RETICULOCYTES
RBC.: 1.84 MIL/uL — ABNORMAL LOW (ref 4.22–5.81)
Retic Count, Absolute: 290.7 10*3/uL — ABNORMAL HIGH (ref 19.0–186.0)
Retic Ct Pct: 15.8 % — ABNORMAL HIGH (ref 0.4–3.1)

## 2012-07-18 LAB — PRO B NATRIURETIC PEPTIDE: Pro B Natriuretic peptide (BNP): 327 pg/mL — ABNORMAL HIGH (ref 0–125)

## 2012-07-18 MED ORDER — HYDROMORPHONE HCL PF 2 MG/ML IJ SOLN
2.0000 mg | INTRAMUSCULAR | Status: DC | PRN
  Administered 2012-07-18 – 2012-07-22 (×16): 2 mg via INTRAVENOUS
  Filled 2012-07-18 (×16): qty 1

## 2012-07-18 MED ORDER — IPRATROPIUM BROMIDE 0.02 % IN SOLN: 0.5000 mg | RESPIRATORY_TRACT | Status: DC | PRN

## 2012-07-18 MED ORDER — ACETAMINOPHEN 325 MG PO TABS
ORAL_TABLET | ORAL | Status: AC
  Administered 2012-07-18: 650 mg
  Filled 2012-07-18: qty 2

## 2012-07-18 MED ORDER — SODIUM CHLORIDE 0.9 % IV SOLN
500.0000 mg | Freq: Three times a day (TID) | INTRAVENOUS | Status: DC
  Administered 2012-07-18 – 2012-07-26 (×24): 500 mg via INTRAVENOUS
  Filled 2012-07-18 (×26): qty 500

## 2012-07-18 MED ORDER — ACETAMINOPHEN 325 MG PO TABS
650.0000 mg | ORAL_TABLET | ORAL | Status: DC | PRN
  Administered 2012-07-19 – 2012-07-24 (×6): 650 mg via ORAL
  Filled 2012-07-18 (×6): qty 2

## 2012-07-18 MED ORDER — ALBUTEROL SULFATE (5 MG/ML) 0.5% IN NEBU: 2.5000 mg | INHALATION_SOLUTION | RESPIRATORY_TRACT | Status: DC | PRN

## 2012-07-18 MED ORDER — DEXTROSE 5 % IV SOLN
500.0000 mg | INTRAVENOUS | Status: DC
  Administered 2012-07-18 – 2012-07-20 (×3): 500 mg via INTRAVENOUS
  Filled 2012-07-18 (×3): qty 500

## 2012-07-18 MED ORDER — VANCOMYCIN HCL IN DEXTROSE 750-5 MG/150ML-% IV SOLN
750.0000 mg | Freq: Three times a day (TID) | INTRAVENOUS | Status: DC
  Administered 2012-07-18 – 2012-07-19 (×5): 750 mg via INTRAVENOUS
  Filled 2012-07-18 (×6): qty 150

## 2012-07-18 MED ORDER — SODIUM CHLORIDE 0.9 % IV BOLUS (SEPSIS)
500.0000 mL | Freq: Once | INTRAVENOUS | Status: AC
  Administered 2012-07-18: 500 mL via INTRAVENOUS

## 2012-07-18 NOTE — Progress Notes (Signed)
ANTIBIOTIC CONSULT NOTE - INITIAL  Pharmacy Consult for Vancomycin (Primaxin/Azithro MD) Indication: Pneumonia   No Known Allergies  Patient Measurements: Height: 5\' 5"  (165.1 cm) Weight: 130 lb (58.968 kg) IBW/kg (Calculated) : 61.5   Vital Signs: Temp: 99.6 F (37.6 C) (06/15 0645) Temp src: Oral (06/15 0645) BP: 124/67 mmHg (06/15 0647) Pulse Rate: 130 (06/15 0647) Intake/Output from previous day: 06/14 0701 - 06/15 0700 In: 2901.7 [P.O.:240; I.V.:2361.7; IV Piggyback:300] Out: 2200 [Urine:2200] Intake/Output from this shift:    Labs:  Recent Labs  07/16/12 0245 07/17/12 0510 07/18/12 0500 07/18/12 0901  WBC 13.4*  --  18.5* 19.1*  HGB 8.2*  --  6.2* 6.6*  PLT 219  --  93* 97*  CREATININE 0.55 0.67 0.60  --    Estimated Creatinine Clearance: 121.9 ml/min (by C-G formula based on Cr of 0.6). No results found for this basename: VANCOTROUGH, VANCOPEAK, VANCORANDOM, GENTTROUGH, GENTPEAK, GENTRANDOM, TOBRATROUGH, TOBRAPEAK, TOBRARND, AMIKACINPEAK, AMIKACINTROU, AMIKACIN,  in the last 72 hours   Microbiology: No results found for this or any previous visit (from the past 720 hour(s)).  Medical History: Past Medical History  Diagnosis Date  . Sickle cell anemia   . Hypertension   . Asthma     Medications:  Scheduled:  . azithromycin  500 mg Intravenous Q24H  . folic acid  1 mg Oral Daily  . heparin  5,000 Units Subcutaneous Q8H  . imipenem-cilastatin  500 mg Intravenous Q8H  . ketorolac  15 mg Intravenous Q6H  . lisinopril  10 mg Oral Daily  . morphine  15 mg Oral Q12H  . polyethylene glycol  17 g Oral Daily  . sodium chloride  3 mL Intravenous Q12H  . vancomycin  750 mg Intravenous Q8H   Infusions:  . sodium chloride 100 mL/hr (07/17/12 2217)   PRN: sodium chloride, albuterol, HYDROcodone-acetaminophen, HYDROmorphone (DILAUDID) injection, ipratropium, naloxone, ondansetron (ZOFRAN) IV, ondansetron, senna, sodium chloride, sodium  chloride Assessment: 22 yo M with SCD admitted with VOC, now with pneumonia on CXR, also tachycardia, tachypnea, low grade fever, and WBC increasing.  Starting broad spectrum abx with vancomycin and priamxin.  Blood cultures sent today   Goal of Therapy:  Vancomycin trough level 15-20 mcg/ml  Plan:  1.) Vancomycin 750 mg IV q8h, Monitor renal function, check vancomycin trough at steady state 2.) Continue Primaxin 500 mg IV q8h 3.) Azithromycin - Consider discontinuing if atypical pneumonia infection not suspected as vancomycin and Primaxin provide broad spectrum abx coverage.   BorgerdingLoma Messing PharmD Pager #: [phone] 10:41 AM 07/18/2012

## 2012-07-18 NOTE — Progress Notes (Signed)
Pharmacy recommedning to dc toradol due to low hgb and platelets. Dr. Joneen Roach notified, toradol dc'd. Cameron [name], Lavone Orn, RN

## 2012-07-18 NOTE — Progress Notes (Signed)
Prior to starting blood transfusion pt temp 102.8. Dr. Joneen Roach notified. Tylenol 650mg  given and will proceed with transfusion per MD. Gifford Shave, RN

## 2012-07-18 NOTE — Progress Notes (Signed)
CRITICAL VALUE ALERT  Critical value received: Hgb 6.2   Date of notification:  07/18/12  Time of notification:  0640  Critical value read back:yes  Nurse who received alert:  Joaquin Music, RN  MD notified (1st page):  Elray Mcgregor, NP  Time of first page:  [phone]  MD notified (2nd page):  Time of second page:  Responding MD:  Elray Mcgregor, NP  Time MD responded:  [phone]

## 2012-07-18 NOTE — Progress Notes (Signed)
CXR completed, notified by radiology that results needed to be seen STAT by ordering physician Dr. Joneen Roach. Dr. Joneen Roach notified, Vancomycin ordered. Will continue to monitor. Adryan Shin, Lavone Orn, RN

## 2012-07-18 NOTE — Progress Notes (Addendum)
Subjective: Patient seen, states pain is better, no complaints his mom is at his bedside. He denies any worsening SOB, cough. His lips are dry. He states his pain is 5/10, now located only in back, no pain in chest. Patient remain tachycardic  Objective: Weight change:   Intake/Output Summary (Last 24 hours) at 07/18/12 1016 Last data filed at 07/18/12 0637  Gross per 24 hour  Intake 2901.67 ml  Output   2200 ml  Net 701.67 ml   BP 124/67  Pulse 130  Temp(Src) 99.6 F (37.6 C) (Oral)  Resp 20  Ht 5\' 5"  (1.651 m)  Wt 58.968 kg (130 lb)  BMI 21.63 kg/m2  SpO2 96%  General Appearance:    Alert, cooperative, no distress, appears stated age  Head:    Normocephalic, without obvious abnormality, atraumatic  Eyes:    PERRL, conjunctiva/corneas clear, EOM's intact, fundi    benign, both eyes          Nose:   Nares normal, septum midline, mucosa normal, no drainage    or sinus tenderness  Throat:   Lips, mucosa, and tongue normal; dry lips  Neck:   Supple, symmetrical, trachea midline, no adenopathy;       thyroid:  No enlargement/tenderness/nodules; no carotid   bruit or JVD  Back:     Symmetric, no curvature, ROM normal, no CVA tenderness  Lungs:   Mildly increased crackles  Chest wall:    No tenderness or deformity  Heart:    Regular rate and rhythm, S1 and S2 normal, no murmur, rub   or gallop  Abdomen:     Soft, non-tender, bowel sounds active all four quadrants,    no masses, no organomegaly        Extremities:   Extremities normal, atraumatic, no cyanosis or edema  Pulses:   2+ and symmetric all extremities  Skin:   Skin color, texture, turgor normal, no rashes or lesions  Lymph nodes:   Cervical, supraclavicular, and axillary nodes normal  Neurologic:   CNII-XII intact. Normal strength, sensation and reflexes      throughout    Lab Results:  Recent Labs  07/16/12 0245 07/17/12 0510 07/18/12 0500  NA 136 131* 134*  K 5.0 4.0 3.9  CL 100 97 100  CO2 26 26 27    GLUCOSE 119* 109* 108*  BUN 9 12 10   CREATININE 0.55 0.67 0.60  CALCIUM 8.9 8.9 8.9  MG 1.9  --   --     Recent Labs  07/17/12 0510 07/18/12 0500  AST 88* 59*  ALT 33 27  ALKPHOS 106 98  BILITOT 5.2* 4.7*  PROT 7.4 6.6  ALBUMIN 3.8 3.2*   No results found for this basename: LIPASE, AMYLASE,  in the last 72 hours  Recent Labs  07/16/12 0245 07/18/12 0500 07/18/12 0901  WBC 13.4* 18.5* 19.1*  NEUTROABS 8.0*  --   --   HGB 8.2* 6.2* 6.6*  HCT 23.0* 17.2* 17.6*  MCV 97.9 96.1 95.7  PLT 219 93* 97*   No results found for this basename: CKTOTAL, CKMB, CKMBINDEX, TROPONINI,  in the last 72 hours No components found with this basename: POCBNP,  No results found for this basename: DDIMER,  in the last 72 hours No results found for this basename: HGBA1C,  in the last 72 hours No results found for this basename: CHOL, HDL, LDLCALC, TRIG, CHOLHDL, LDLDIRECT,  in the last 72 hours No results found for this basename: TSH, T4TOTAL, FREET3, T3FREE,  THYROIDAB,  in the last 72 hours  Recent Labs  07/16/12 0245 07/18/12 0901  RETICCTPCT 12.7* 15.8*    Micro Results: No results found for this or any previous visit (from the past 240 hour(s)).  Studies/Results: Dg Chest 1 View  07/18/2012   *RADIOLOGY REPORT*  Clinical Data: Chest pain. Shortness of breath.  Productive cough. Sickle cell disease.  Asthma.  Elevated white count.  CHEST - 1 VIEW  Comparison: 07/16/2012 chest x-ray and chest CT.  Findings: Progressive asymmetric air space disease most notable mid to lower lung zone greater left lower lobe raising possibility of infectious infiltrate given the clinical information provided. Atelectasis contributing to the right lung parenchymal changes may also be present.  Cardiomegaly.  Pulmonary vascular congestion/edema.  Gas distended bowel.  Scoliosis thoracic spine.  IMPRESSION:  Progressive asymmetric air space disease most notable mid to lower lung zone greater left lower lobe  raising possibility of infectious infiltrate given the clinical information provided.  Atelectasis contributing to the right lung parenchymal changes may also be present.  Cardiomegaly.  Pulmonary vascular congestion/edema.  Gas distended bowel.  This is a call report.  [facility] radiology technologist)   Original Report Authenticated By: Lacy Duverney, M.D.   Dg Chest 2 View  07/16/2012   *RADIOLOGY REPORT*  Clinical Data: Severe upper back pain.  Sickle cell crisis.  CHEST - 2 VIEW  Comparison: 02/20/2011  Findings: Shallow inspiration.  The heart size and pulmonary vascularity are normal.  No focal airspace consolidation or edema. No blunting of costophrenic angles.  Soft tissue prominence along the right heart border is probably related to vascular crowding due to shallow inspiration.  No corresponding changes are demonstrated on the lateral view.  No pneumothorax.  Surgical clips in the right upper quadrant.  IMPRESSION: Shallow inspiration.  No evidence of active pulmonary disease.   Original Report Authenticated By: Burman Nieves, M.D.   Ct Angio Chest Pe W/cm &/or Wo Cm  07/16/2012   *RADIOLOGY REPORT*  Clinical Data: Chest pain and tachycardia.  Sickle cell pain. History of hypertension and asthma.  White cell count 13.4. Nonsmoker.  CT ANGIOGRAPHY CHEST  Technique:  Multidetector CT imaging of the chest using the standard protocol during bolus administration of intravenous contrast. Multiplanar reconstructed images including MIPs were obtained and reviewed to evaluate the vascular anatomy.  Contrast: OMNIPAQUE IOHEXOL 350 MG/ML SOLN  Comparison: None.  Findings: Technically adequate study with good opacification of the central and segmental pulmonary arteries.  No focal filling defects are demonstrated.  No evidence of significant pulmonary embolus.  Normal caliber thoracic aorta without evidence of dissection. Cardiac enlargement.  The esophagus is fluid-filled and distended which may suggest  reflux or dysmotility.  No significant lymphadenopathy in the chest.  No pleural effusions. Diffusely scattered patchy atelectasis or infiltration in the lungs. No pneumothorax.  Airways appear patent.  Thoracic vertebrae appear intact without displacement or compression.  Visualized portions of the upper abdominal organs demonstrate atrophic spleen with increased density.  Small cyst in the spleen. Changes are consistent with sickle cell change.  IMPRESSION: No evidence of significant pulmonary embolus.  Fluid filled mildly distended esophagus may represent reflux or dysmotility.  Patchy distribution of areas of atelectasis or infiltration in the lungs.   Original Report Authenticated By: Burman Nieves, M.D.   Medications: Scheduled Meds: . azithromycin  500 mg Intravenous Q24H  . folic acid  1 mg Oral Daily  . heparin  5,000 Units Subcutaneous Q8H  . imipenem-cilastatin  500 mg Intravenous Q8H  . ketorolac  15 mg Intravenous Q6H  . lisinopril  10 mg Oral Daily  . morphine  15 mg Oral Q12H  . polyethylene glycol  17 g Oral Daily  . sodium chloride  3 mL Intravenous Q12H   Continuous Infusions: . sodium chloride 100 mL/hr (07/17/12 2217)   PRN Meds:.sodium chloride, albuterol, HYDROcodone-acetaminophen, HYDROmorphone (DILAUDID) injection, ipratropium, naloxone, ondansetron (ZOFRAN) IV, ondansetron, senna, sodium chloride, sodium chloride  Assessment/Plan: @PROBHOSP @  LOS: 2 days   CAP/sepsis: worsened since admission. Increased WBC, worsening anemia and thrombocytopenia - patient pan cultures. Stat CXR reveals progression of infection. Levaquin discontinued. Imipenem, azithromycin and vancomycin ordered. Continue oxygen. Nebulizers ordered PRN. Patient with no chest pain.  Sickle cell anemia: Stat repeat CBC ordered to verify initial labs. Hydroxyuea discontinued, single PRBC ordered by night cover, will add post transfusion h/h. D/c heparin for DVT prophylaxis, substitute SCD's until  platelets stablize Sickle cell crisis: pain improved with current regimen but still present, increased dilaudid to 2 mg every 2 hours PRN, continue MS Contin scheduled. No chest pain still with back pain. Clinical dehydration: doubt pulmonary edema but will add a BNP. Continue IVF for now, patient appears clinically dehydrated on examination. Tachycardia: persisting, sinus tachy - monitor Nausea: likely related to pneumonia, zofran ordered PRN  Code Status: Full Code Disposition: home Discharge: hopefully in next 48-72 hours.   Cameron [name] 07/18/2012, 10:16 AM

## 2012-07-19 ENCOUNTER — Inpatient Hospital Stay (HOSPITAL_COMMUNITY): Payer: BC Managed Care – PPO

## 2012-07-19 LAB — COMPREHENSIVE METABOLIC PANEL
ALT: 26 U/L (ref 0–53)
AST: 47 U/L — ABNORMAL HIGH (ref 0–37)
Alkaline Phosphatase: 80 U/L (ref 39–117)
CO2: 31 mEq/L (ref 19–32)
Calcium: 8.6 mg/dL (ref 8.4–10.5)
Potassium: 4.2 mEq/L (ref 3.5–5.1)
Sodium: 132 mEq/L — ABNORMAL LOW (ref 135–145)
Total Protein: 6.6 g/dL (ref 6.0–8.3)

## 2012-07-19 LAB — CBC
HCT: 18.7 % — ABNORMAL LOW (ref 39.0–52.0)
Hemoglobin: 6.9 g/dL — CL (ref 13.0–17.0)
MCH: 34 pg (ref 26.0–34.0)
MCHC: 36.9 g/dL — ABNORMAL HIGH (ref 30.0–36.0)

## 2012-07-19 LAB — LEGIONELLA ANTIGEN, URINE: Legionella Antigen, Urine: NEGATIVE

## 2012-07-19 LAB — VANCOMYCIN, TROUGH: Vancomycin Tr: 6.7 ug/mL — ABNORMAL LOW (ref 10.0–20.0)

## 2012-07-19 MED ORDER — IBUPROFEN 800 MG PO TABS
800.0000 mg | ORAL_TABLET | Freq: Four times a day (QID) | ORAL | Status: DC | PRN
  Administered 2012-07-20 – 2012-07-21 (×2): 800 mg via ORAL
  Filled 2012-07-19 (×2): qty 1

## 2012-07-19 MED ORDER — VANCOMYCIN HCL 500 MG IV SOLR
500.0000 mg | Freq: Once | INTRAVENOUS | Status: AC
  Administered 2012-07-19: 500 mg via INTRAVENOUS
  Filled 2012-07-19: qty 500

## 2012-07-19 MED ORDER — SODIUM CHLORIDE 0.9 % IV SOLN
1250.0000 mg | Freq: Three times a day (TID) | INTRAVENOUS | Status: DC
  Administered 2012-07-20: 1250 mg via INTRAVENOUS
  Filled 2012-07-19 (×3): qty 1250

## 2012-07-19 NOTE — Progress Notes (Signed)
ANTIBIOTIC CONSULT NOTE - FOLLOW UP  Pharmacy Consult for Vancomycin Indication: PNA, r/o sepsis  No Known Allergies  Patient Measurements: Height: 5\' 5"  (165.1 cm) Weight: 130 lb (58.968 kg) IBW/kg (Calculated) : 61.5 Adjusted Body Weight:   Vital Signs: Temp: 102.8 F (39.3 C) (06/16 2050) Temp src: Oral (06/16 2050) BP: 123/53 mmHg (06/16 2050) Pulse Rate: 130 (06/16 2050) Intake/Output from previous day: 06/15 0701 - 06/16 0700 In: 4010 [P.O.:120; I.V.:2593.3; Blood:296.7; IV Piggyback:1000] Out: 1800 [Urine:1800] Intake/Output from this shift: Total I/O In: -  Out: 800 [Urine:800]  Labs:  Recent Labs  07/17/12 0510  07/18/12 0500 07/18/12 0901 07/18/12 1831 07/19/12 0432  WBC  --   --  18.5* 19.1*  --  18.9*  HGB  --   < > 6.2* 6.6* 7.3* 6.9*  PLT  --   --  93* 97*  --  100*  CREATININE 0.67  --  0.60  --   --  0.59  < > = values in this interval not displayed. Estimated Creatinine Clearance: 121.9 ml/min (by C-G formula based on Cr of 0.59).  Recent Labs  07/19/12 1900  VANCOTROUGH 6.7*     Microbiology: Recent Results (from the past 720 hour(s))  CULTURE, BLOOD (ROUTINE X 2)     Status: None   Collection Time    07/18/12  9:01 AM      Result Value Range Status   Specimen Description BLOOD LEFT ARM   Final   Special Requests     Final   Value: BOTTLES DRAWN AEROBIC AND ANAEROBIC 7.5 CC BOTH BOTTLES   Culture  Setup Time 07/18/2012 17:07   Final   Culture     Final   Value:        BLOOD CULTURE RECEIVED NO GROWTH TO DATE CULTURE WILL BE HELD FOR 5 DAYS BEFORE ISSUING A FINAL NEGATIVE REPORT   Report Status PENDING   Incomplete  CULTURE, BLOOD (ROUTINE X 2)     Status: None   Collection Time    07/18/12  9:08 AM      Result Value Range Status   Specimen Description BLOOD LEFT HAND   Final   Special Requests     Final   Value: BOTTLES DRAWN AEROBIC AND ANAEROBIC 4.5CC BOTH BOTTLES   Culture  Setup Time 07/18/2012 17:07   Final   Culture      Final   Value:        BLOOD CULTURE RECEIVED NO GROWTH TO DATE CULTURE WILL BE HELD FOR 5 DAYS BEFORE ISSUING A FINAL NEGATIVE REPORT   Report Status PENDING   Incomplete  CULTURE, EXPECTORATED SPUTUM-ASSESSMENT     Status: None   Collection Time    07/18/12  1:31 PM      Result Value Range Status   Specimen Description SPUTUM   Final   Special Requests NONE   Final   Sputum evaluation     Final   Value: THIS SPECIMEN IS ACCEPTABLE. RESPIRATORY CULTURE REPORT TO FOLLOW.   Report Status 07/18/2012 FINAL   Final  CULTURE, RESPIRATORY (NON-EXPECTORATED)     Status: None   Collection Time    07/18/12  1:31 PM      Result Value Range Status   Specimen Description SPU   Final   Special Requests NONE   Final   Gram Stain PENDING   Incomplete   Culture Culture reincubated for better growth   Final   Report Status PENDING   Incomplete  Anti-infectives   Start     Dose/Rate Route Frequency Ordered Stop   07/18/12 1200  imipenem-cilastatin (PRIMAXIN) 500 mg in sodium chloride 0.9 % 100 mL IVPB     500 mg 200 mL/hr over 30 Minutes Intravenous 3 times per day 07/18/12 0928     07/18/12 1200  vancomycin (VANCOCIN) IVPB 750 mg/150 ml premix     750 mg 150 mL/hr over 60 Minutes Intravenous Every 8 hours 07/18/12 1023     07/18/12 1000  azithromycin (ZITHROMAX) 500 mg in dextrose 5 % 250 mL IVPB     500 mg 250 mL/hr over 60 Minutes Intravenous Every 24 hours 07/18/12 0928     07/16/12 1400  levofloxacin (LEVAQUIN) IVPB 750 mg  Status:  Discontinued     750 mg 100 mL/hr over 90 Minutes Intravenous Every 24 hours 07/16/12 1128 07/18/12 0922   07/16/12 0445  cefTRIAXone (ROCEPHIN) 2 g in dextrose 5 % 50 mL IVPB     2 g 100 mL/hr over 30 Minutes Intravenous  Once 07/16/12 0436 07/16/12 0731   07/16/12 0445  azithromycin (ZITHROMAX) 500 mg in dextrose 5 % 250 mL IVPB     500 mg 250 mL/hr over 60 Minutes Intravenous  Once 07/16/12 0436 07/16/12 1191      Assessment: 22 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 CXR on admit with no evidence of infection but pt found tachycardic, hypoxic, elevated WBC - CT with patchy distribution of atelectasis vs infiltration in the lungs. Levaquin started for r/o CAP without improvement. On 6/15, WBC continued to rise, pt febrile, anemia and thrombocytopenia worsened, CXR with progression of infection - worried for sepsis, abx broadened to Imipenem, Azithromycin and Vancomycin.   Vanc trough= 6.7 on Vancomycin 750mg  IV q 8 hours.   Goal of Therapy:   Vancomycin trough level 15-20 mcg/ml  Plan:  Increase Vancomycin to 1250mg  IV q 8 hours. Will give an extra 500mg  now to equal 1250mg  IV Need to follow with vanc trough level after 4th dose to be sure that therapeutic levels are reached. Measure antibiotic drug levels at steady state Follow up culture results  Loletta Specter 07/19/2012,9:42 PM

## 2012-07-19 NOTE — Progress Notes (Signed)
ANTIBIOTIC CONSULT NOTE - FOLLOW UP  Pharmacy Consult for Vancomycin Indication: PNA, r/o sepsis  No Known Allergies  Patient Measurements: Height: 5\' 5"  (165.1 cm) Weight: 130 lb (58.968 kg) IBW/kg (Calculated) : 61.5  Vital Signs: Temp: 102.9 F (39.4 C) (06/16 1420) Temp src: Axillary (06/16 1420) BP: 121/54 mmHg (06/16 1420) Pulse Rate: 68 (06/16 1420) Intake/Output from previous day: 06/15 0701 - 06/16 0700 In: 4010 [P.O.:120; I.V.:2593.3; Blood:296.7; IV Piggyback:1000] Out: 1800 [Urine:1800] Intake/Output from this shift: Total I/O In: 120 [P.O.:120] Out: 400 [Urine:400]  Labs:  Recent Labs  07/17/12 0510  07/18/12 0500 07/18/12 0901 07/18/12 1831 07/19/12 0432  WBC  --   --  18.5* 19.1*  --  18.9*  HGB  --   < > 6.2* 6.6* 7.3* 6.9*  PLT  --   --  93* 97*  --  100*  CREATININE 0.67  --  0.60  --   --  0.59  < > = values in this interval not displayed. Estimated Creatinine Clearance: 121.9 ml/min (by C-G formula based on Cr of 0.59). No results found for this basename: Rolm Gala, VANCORANDOM, GENTTROUGH, GENTPEAK, GENTRANDOM, TOBRATROUGH, TOBRAPEAK, TOBRARND, AMIKACINPEAK, AMIKACINTROU, AMIKACIN,  in the last 72 hours    Assessment: 22 yo M with SCD admitted 6/13 with [place] pain crisis. CXR on admit with no evidence of infection but pt found tachycardic, hypoxic, elevated WBC - CT with patchy distribution of atelectasis vs infiltration in the lungs. Levaquin started for r/o CAP without improvement. On 6/15, WBC continued to rise, pt febrile, anemia and thrombocytopenia worsened, CXR with progression of infection - worried for sepsis, abx broadened to Imipenem, Azithromycin and Vancomycin.   Antibiotics thus far this admission  6/13 CTX x1 6/13 Azithro x1 6/13 >> Levaquin >> 6/15 6/15 >> Vancomycin (Rx) >>  6/15 >> Primaxin (MD) >>  6/15 >> Azithro (MD) >>   Patient was febrile overnight with Tmax of 102.9, tachycardic, WBC remains high/stable  at 18.9K, Scr wnl/stable for CG and normalized CrCl > 100 ml/min.  Blood cultures and sputum culture from 6/15 pending.    Goal of Therapy:  Vancomycin trough level 15-20 mcg/ml  Plan:   Continue Vancomycin 750 mg IV q8h for now.  Pharmacy will check a vancomycin trough prior to dose at 2000 tonight and adjust as needed  Continue Azithromycin and Primaxin ordered by MD - f/u to narrow   Geoffry Paradise, PharmD, BCPS Pager: [phone] 2:29 PM Pharmacy #: [id number]

## 2012-07-19 NOTE — Progress Notes (Signed)
Subjective: Patient is 22 yo admitted with sickle cell painful crisis and community acquired pneumonia with probable acute chest syndrome. Patient is having shortness of breath and still having fever. No nausea or vomiting, no diarrhea. His pain is adequately controlled on his current regimen.He is however having left hip pain which is persistent. This is new this hospitalization. He rated it as 9/10 at this height, made worse by getting up and putting weight on his left foot.  Objective: Vital signs in last 24 hours: Temp:  [99.9 F (37.7 C)-102.9 F (39.4 C)] 102.9 F (39.4 C) (06/16 1420) Pulse Rate:  [68-157] 68 (06/16 1420) Resp:  [18-22] 18 (06/16 1420) BP: (121-145)/(54-75) 121/54 mmHg (06/16 1420) SpO2:  [91 %-96 %] 93 % (06/16 1420) Weight change:  Last BM Date: 07/15/12  Intake/Output from previous day: 06/15 0701 - 06/16 0700 In: 4010 [P.O.:120; I.V.:2593.3; Blood:296.7; IV Piggyback:1000] Out: 1800 [Urine:1800] Intake/Output this shift: Total I/O In: 120 [P.O.:120] Out: 400 [Urine:400]  General appearance: alert, cooperative and mild distress Eyes: conjunctivae/corneas clear. PERRL, EOM's intact. Fundi benign. Throat: lips, mucosa, and tongue normal; teeth and gums normal Neck: no adenopathy, no carotid bruit, no JVD, supple, symmetrical, trachea midline and thyroid not enlarged, symmetric, no tenderness/mass/nodules Resp: rales bilaterally and rhonchi bibasilar Chest wall: no tenderness Cardio: regular rate and rhythm, S1, S2 normal, no murmur, click, rub or gallop GI: soft, non-tender; bowel sounds normal; no masses,  no organomegaly Extremities: extremities normal, atraumatic, no cyanosis or edema Pulses: 2+ and symmetric Skin: Skin color, texture, turgor normal. No rashes or lesions Neurologic: Grossly normal  Lab Results:  Recent Labs  07/18/12 0901 07/18/12 1831 07/19/12 0432  WBC 19.1*  --  18.9*  HGB 6.6* 7.3* 6.9*  HCT 17.6* 20.2* 18.7*  PLT 97*   --  100*   BMET  Recent Labs  07/18/12 0500 07/19/12 0432  NA 134* 132*  K 3.9 4.2  CL 100 96  CO2 27 31  GLUCOSE 108* 116*  BUN 10 7  CREATININE 0.60 0.59  CALCIUM 8.9 8.6    Studies/Results: Dg Chest 1 View  07/18/2012   *RADIOLOGY REPORT*  Clinical Data: Chest pain. Shortness of breath.  Productive cough. Sickle cell disease.  Asthma.  Elevated white count.  CHEST - 1 VIEW  Comparison: 07/16/2012 chest x-ray and chest CT.  Findings: Progressive asymmetric air space disease most notable mid to lower lung zone greater left lower lobe raising possibility of infectious infiltrate given the clinical information provided. Atelectasis contributing to the right lung parenchymal changes may also be present.  Cardiomegaly.  Pulmonary vascular congestion/edema.  Gas distended bowel.  Scoliosis thoracic spine.  IMPRESSION:  Progressive asymmetric air space disease most notable mid to lower lung zone greater left lower lobe raising possibility of infectious infiltrate given the clinical information provided.  Atelectasis contributing to the right lung parenchymal changes may also be present.  Cardiomegaly.  Pulmonary vascular congestion/edema.  Gas distended bowel.  This is a call report.  [facility] radiology technologist)   Original Report Authenticated By: Lacy Duverney, M.D.   Dg Hip Complete Left  07/19/2012   *RADIOLOGY REPORT*  Clinical Data: Sickle cell anemia, left hip and groin pain, difficulty with flexion and abduction  LEFT HIP - COMPLETE 2+ VIEW  Comparison: None.  Findings: Symmetric hip joints. Osseous mineralization grossly normal for technique. No acute fracture, dislocation or bone destruction. Small pelvic phleboliths noted.  IMPRESSION: No definite acute osseous abnormalities.   Original Report Authenticated By: Loraine Leriche  Tyron Russell, M.D.    Medications: I have reviewed the patient's current medications.  Assessment/Plan: 22 year old gentleman with community-acquired pneumonia and sickle  cell painful crisis now with left hip pain and limitation of movement.  #1 left hip pain: Differential is multiple including sickle cell crisis pain, avascular necrosis, osteoarthritis and septic arthritis. X-ray of the hip shows no definite acute abnormality. We may need to get an MRI of the left hip if pain is not controlled. Continue his current pain medications and consider physical therapy once patient is able to walk.  #2 sickle cell painful crisis: Again improving and doing better on his current pain regimen. I will therefore not make any major changes.  #3 community-acquired pneumonia: Initial treatment was not successful. Patient's air space disease is worsening and now he is on vancomycin, Primaxin and a Zithromax. His white count is coming down but he still febrile. If patient remains febrile in the next 24 hours we will have to get ID involved. Consider other causes of fever as well including septic arthritis. Blood cultures would be re-checked.  #4 leukocytosis: Again improving, most likely secondary to pneumonia but also VOC.  #5 sickle cell anemia: Patient is status post transfusion of 1 unit packed red blood cells yesterday. His hemoglobin is still below 7. Will recheck in the morning if he still below 7 we may have to transfuse another unit.  #6 hypertension: He is on lisinopril I will continue with that.     LOS: 3 days    Savannah Morford,LAWAL 07/19/2012, 5:12 PM

## 2012-07-19 NOTE — Progress Notes (Signed)
CRITICAL VALUE ALERT  Critical value received:  Hgb-6.9  Date of notification: 07/19/2012  Time of notification:  0509  Critical value read back:yes  Nurse who received alert:  Roosvelt Maser, RN  MD notified (1st page): C. Gherghe  Time of first page:  [phone]  MD notified (2nd page):  Time of second page:  Responding MD:  Wendy Poet  Time MD responded:  [phone]

## 2012-07-19 NOTE — Progress Notes (Signed)
Patient transfuse on hold due to temp of 102.8. Wendy Poet, MD notified and told to hold transfuse until seen by rounding physician. I will continue to monitor the patient and report off to on coming nurse. Roosvelt Maser, RN.

## 2012-07-20 ENCOUNTER — Inpatient Hospital Stay (HOSPITAL_COMMUNITY): Payer: BC Managed Care – PPO

## 2012-07-20 DIAGNOSIS — R509 Fever, unspecified: Secondary | ICD-10-CM

## 2012-07-20 DIAGNOSIS — M25459 Effusion, unspecified hip: Secondary | ICD-10-CM

## 2012-07-20 DIAGNOSIS — B370 Candidal stomatitis: Secondary | ICD-10-CM

## 2012-07-20 DIAGNOSIS — J189 Pneumonia, unspecified organism: Principal | ICD-10-CM

## 2012-07-20 DIAGNOSIS — M25519 Pain in unspecified shoulder: Secondary | ICD-10-CM

## 2012-07-20 DIAGNOSIS — D72829 Elevated white blood cell count, unspecified: Secondary | ICD-10-CM

## 2012-07-20 LAB — URINALYSIS, ROUTINE W REFLEX MICROSCOPIC
Glucose, UA: NEGATIVE mg/dL
Hgb urine dipstick: NEGATIVE
Specific Gravity, Urine: 1.017 (ref 1.005–1.030)

## 2012-07-20 LAB — COMPREHENSIVE METABOLIC PANEL
BUN: 9 mg/dL (ref 6–23)
CO2: 30 mEq/L (ref 19–32)
Chloride: 97 mEq/L (ref 96–112)
Creatinine, Ser: 0.5 mg/dL (ref 0.50–1.35)
GFR calc Af Amer: >90 mL/min (ref >90)
GFR calc non Af Amer: >90 mL/min (ref >90)
Glucose, Bld: 106 mg/dL — ABNORMAL HIGH (ref 70–99)
Total Bilirubin: 5.5 mg/dL — ABNORMAL HIGH (ref 0.3–1.2)

## 2012-07-20 LAB — CBC WITH DIFFERENTIAL/PLATELET
Eosinophils Relative: 0 % (ref 0–5)
MCH: 33.3 pg (ref 26.0–34.0)
Monocytes Absolute: 2.9 10*3/uL — ABNORMAL HIGH (ref 0.1–1.0)
Neutrophils Relative %: 70 % (ref 43–77)
Platelets: 105 10*3/uL — ABNORMAL LOW (ref 150–400)
RBC: 1.86 MIL/uL — ABNORMAL LOW (ref 4.22–5.81)
WBC: 16.2 10*3/uL — ABNORMAL HIGH (ref 4.0–10.5)

## 2012-07-20 LAB — CULTURE, RESPIRATORY W GRAM STAIN

## 2012-07-20 LAB — URINE MICROSCOPIC-ADD ON

## 2012-07-20 MED ORDER — FLUCONAZOLE 100MG IVPB: 100.0000 mg | INTRAVENOUS | Status: DC

## 2012-07-20 MED ORDER — FLUCONAZOLE IN SODIUM CHLORIDE 200-0.9 MG/100ML-% IV SOLN
200.0000 mg | Freq: Once | INTRAVENOUS | Status: AC
  Administered 2012-07-20: 200 mg via INTRAVENOUS
  Filled 2012-07-20: qty 100

## 2012-07-20 MED ORDER — IBUPROFEN 400 MG PO TABS
400.0000 mg | ORAL_TABLET | ORAL | Status: AC
  Administered 2012-07-20: 400 mg via ORAL
  Filled 2012-07-20: qty 1

## 2012-07-20 MED ORDER — FLUCONAZOLE 100 MG PO TABS
100.0000 mg | ORAL_TABLET | Freq: Every day | ORAL | Status: DC
  Administered 2012-07-20 – 2012-07-26 (×7): 100 mg via ORAL
  Filled 2012-07-20 (×7): qty 1

## 2012-07-20 MED ORDER — VANCOMYCIN HCL 10 G IV SOLR
1250.0000 mg | Freq: Three times a day (TID) | INTRAVENOUS | Status: DC
  Administered 2012-07-20 – 2012-07-22 (×5): 1250 mg via INTRAVENOUS
  Filled 2012-07-20 (×7): qty 1250

## 2012-07-20 NOTE — Progress Notes (Signed)
Pt temp responsive to Ibuporfen---now decreased -current temp 99.4.  SP, RN

## 2012-07-20 NOTE — Progress Notes (Signed)
Continue to have increase temperature during the shift. Tylendol given as ordered, encouaged to use the IS--1026ml and encourage fluid intake. Will continue to monitor. [employer], RN

## 2012-07-20 NOTE — Plan of Care (Signed)
Problem: Phase I Progression Outcomes Goal: Hemodynamically stable Outcome: Not Progressing Elevated temps--

## 2012-07-20 NOTE — Progress Notes (Signed)
SICKLE CELL SERVICE PROGRESS NOTE  Cameron [name] ZOX:[id number] DOB: Mar 05, 1990 DOA: 07/16/2012 PCP: No PCP Per Patient  Assessment/Plan: Principal Problem:    Tachycardia: The patient has a significant tachycardia which is somewhat concerning. He has no prior history of tachycardia. His rhythm on telemetry it is a sinus tachycardia without any evidence of atrial fibrillation or other dysrhythmias. At the tachycardia does not resolve within the next 24 hours, I will obtain a 2-D echocardiogram for this patient.    Fever, unspecified: The patient has a fever the source of which is unclear. Although he has some Candida in the sputum is likely more of a colonization rather than a pathogen althoughthe patient does have some evidence of oral candidiasis. Thus I started the patient on fluconazole. The patient could possibly be having fevers associated with the sickle cell crisis although he has very little pain to indicate an intense vaso-occlusive episode. The patient had an x-ray 1 view which showed evidence of community-acquired pneumonia. We'll repeat a two-view x-ray to get a better evaluation of the parenchyma. The patient has hardware in the shoulder and this could certainly also some inspection although the patient has no significant pain to indicate such. I have asked infectious diseases to see the patient consultation. I have discussed this with Dr. Synthia [name] and he is asked that we hold off on changing the antibiotics until he's had a chance to see the patient.    CAP (community acquired pneumonia): See above    Leukocytosis: Likely related to an infectious process versus acute vaso-occlusive episode. 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 Clelia [name] at [facility]. He has very few crises throughout the last hospitalization being more than one year ago. He is on hydroxyurea but not on folic acid. I will try to reach Dr. Clelia [name] regarding  the patient. In the meantime I will start the patient on folic acid 1 mg daily by mouth    Code Status: Full Code Family Communication: N/A Disposition Plan: Not yet determined  Cameron [name] A.  Pager [phone]. If 7PM-7AM, please contact night-coverage.  07/20/2012, 2:11 PM  LOS: 4 days   Brief narrative: Cameron [name] is a 22 y.o. male with history of sickle cell with prior admission for sickle cell pain crisis manifested reportedly through back and chest discomfort. Patient states that the pain first occurred last night. He tried his oxycodone without much relief. Pain has been persistent and gradually getting worse.  While in the ED given his complaint of chest discomfort patient had CT of chest which was negative for PE but did make mention patchy distribution in lungs suspicious for infiltrates.  We have been consulted for admission evaluation and recommendations for management of sickle cell pain crisis associated with possible pna.    Consultants:  ID  Procedures:  None   Antibiotics:  Cetiaxone 6/13>>6/13  Azithromycin 6/13 >>6/17  Imepenam 6/15 >>  Vancomycin 6/15 >>  Diflucan 6/17 >>  HPI/Subjective: Patient states that this pain is essentially 0/10 at this point after use only Vicodin. He still continues to have a sputum productive of greenish yellowish sputum.   Objective: Filed Vitals:   07/19/12 2245 07/20/12 0421 07/20/12 0604 07/20/12 1153  BP:   135/43   Pulse:   126   Temp: 101 F (38.3 C) 103 F (39.4 C) 99.4 F (37.4 C) 98.9 F (37.2 C)  TempSrc:   Oral Oral  Resp:   24   Height:      Weight:      SpO2:   94%    Weight change:   Intake/Output Summary (Last 24 hours) at 07/20/12 1411 Last data filed at 07/20/12 0830  Gross per 24 hour  Intake   3260 ml  Output   3200 ml  Net     60 ml    General: Alert, awake, oriented x3, in no acute distress.  HEENT: Cameron [name]/AT PEERL, EOMI, mild icterus OROPHARYNX:  Moist, No exudate/  erythema/lesions, oral Candida present Heart: Regular rate and rhythm, without murmurs, rubs, gallops, tachycardia on auscultation.  Lungs: Clear to auscultation, no wheezing or rhonchi noted.  Abdomen: Soft, nontender, nondistended, positive bowel sounds, no masses no hepatosplenomegaly noted..  Neuro: No focal neurological deficits noted cranial nerves II through XII grossly intact.. Strength functional in bilateral upper and lower extremities. Musculoskeletal: No warm swelling or erythema around joints, no spinal tenderness noted. No tenderness in the shoulder or hip areas appear Psychiatric: Patient alert and oriented x3, good insight and cognition, good recent to remote recall.    Data Reviewed: Basic Metabolic Panel:  Recent Labs Lab 07/16/12 0245 07/17/12 0510 07/18/12 0500 07/19/12 0432 07/20/12 0500  NA 136 131* 134* 132* 134*  K 5.0 4.0 3.9 4.2 3.6  CL 100 97 100 96 97  CO2 26 26 27 31 30   GLUCOSE 119* 109* 108* 116* 106*  BUN 9 12 10 7 9   CREATININE 0.55 0.67 0.60 0.59 0.50  CALCIUM 8.9 8.9 8.9 8.6 8.5  MG 1.9  --   --   --   --    Liver Function Tests:  Recent Labs Lab 07/16/12 0245 07/17/12 0510 07/18/12 0500 07/19/12 0432 07/20/12 0500  AST 53* 88* 59* 47* 39*  ALT 17 33 27 26 26   ALKPHOS 69 106 98 80 71  BILITOT 5.3* 5.2* 4.7* 6.0* 5.5*  PROT 8.3 7.4 6.6 6.6 6.5  ALBUMIN 4.4 3.8 3.2* 3.1* 3.0*   No results found for this basename: LIPASE, AMYLASE,  in the last 168 hours No results found for this basename: AMMONIA,  in the last 168 hours CBC:  Recent Labs Lab 07/16/12 0245 07/18/12 0500 07/18/12 0901 07/18/12 1831 07/19/12 0432 07/20/12 0500  WBC 13.4* 18.5* 19.1*  --  18.9* 16.2*  NEUTROABS 8.0*  --   --   --   --  11.4*  HGB 8.2* 6.2* 6.6* 7.3* 6.9* 6.2*  HCT 23.0* 17.2* 17.6* 20.2* 18.7* 17.4*  MCV 97.9 96.1 95.7  --  92.1 93.5  PLT 219 93* 97*  --  100* 105*   Cardiac Enzymes: No results found for this basename: CKTOTAL, CKMB,  CKMBINDEX, TROPONINI,  in the last 168 hours BNP (last 3 results)  Recent Labs  07/18/12 0901  PROBNP 327.0*   CBG: No results found for this basename: GLUCAP,  in the last 168 hours  Recent Results (from the past 240 hour(s))  CULTURE, BLOOD (ROUTINE X 2)     Status: None   Collection Time    07/18/12  9:01 AM      Result Value Range Status   Specimen Description BLOOD LEFT ARM   Final   Special Requests     Final   Value: BOTTLES DRAWN AEROBIC AND ANAEROBIC 7.5 CC BOTH BOTTLES   Culture  Setup Time 07/18/2012 17:07   Final   Culture     Final   Value:  BLOOD CULTURE RECEIVED NO GROWTH TO DATE CULTURE WILL BE HELD FOR 5 DAYS BEFORE ISSUING A FINAL NEGATIVE REPORT   Report Status PENDING   Incomplete  CULTURE, BLOOD (ROUTINE X 2)     Status: None   Collection Time    07/18/12  9:08 AM      Result Value Range Status   Specimen Description BLOOD LEFT HAND   Final   Special Requests     Final   Value: BOTTLES DRAWN AEROBIC AND ANAEROBIC 4.5CC BOTH BOTTLES   Culture  Setup Time 07/18/2012 17:07   Final   Culture     Final   Value:        BLOOD CULTURE RECEIVED NO GROWTH TO DATE CULTURE WILL BE HELD FOR 5 DAYS BEFORE ISSUING A FINAL NEGATIVE REPORT   Report Status PENDING   Incomplete  CULTURE, EXPECTORATED SPUTUM-ASSESSMENT     Status: None   Collection Time    07/18/12  1:31 PM      Result Value Range Status   Specimen Description SPUTUM   Final   Special Requests NONE   Final   Sputum evaluation     Final   Value: THIS SPECIMEN IS ACCEPTABLE. RESPIRATORY CULTURE REPORT TO FOLLOW.   Report Status 07/18/2012 FINAL   Final  CULTURE, RESPIRATORY (NON-EXPECTORATED)     Status: None   Collection Time    07/18/12  1:31 PM      Result Value Range Status   Specimen Description SPU   Final   Special Requests NONE   Final   Gram Stain     Final   Value: FEW WBC PRESENT, PREDOMINANTLY PMN     RARE SQUAMOUS EPITHELIAL CELLS PRESENT     NO ORGANISMS SEEN   Culture FEW  CANDIDA ALBICANS   Final   Report Status 07/20/2012 FINAL   Final     Studies: Dg Chest 1 View  07/18/2012   *RADIOLOGY REPORT*  Clinical Data: Chest pain. Shortness of breath.  Productive cough. Sickle cell disease.  Asthma.  Elevated white count.  CHEST - 1 VIEW  Comparison: 07/16/2012 chest x-ray and chest CT.  Findings: Progressive asymmetric air space disease most notable mid to lower lung zone greater left lower lobe raising possibility of infectious infiltrate given the clinical information provided. Atelectasis contributing to the right lung parenchymal changes may also be present.  Cardiomegaly.  Pulmonary vascular congestion/edema.  Gas distended bowel.  Scoliosis thoracic spine.  IMPRESSION:  Progressive asymmetric air space disease most notable mid to lower lung zone greater left lower lobe raising possibility of infectious infiltrate given the clinical information provided.  Atelectasis contributing to the right lung parenchymal changes may also be present.  Cardiomegaly.  Pulmonary vascular congestion/edema.  Gas distended bowel.  This is a call report.  [facility] radiology technologist)   Original Report Authenticated By: Lacy Duverney, M.D.   Dg Chest 2 View  07/16/2012   *RADIOLOGY REPORT*  Clinical Data: Severe upper back pain.  Sickle cell crisis.  CHEST - 2 VIEW  Comparison: 02/20/2011  Findings: Shallow inspiration.  The heart size and pulmonary vascularity are normal.  No focal airspace consolidation or edema. No blunting of costophrenic angles.  Soft tissue prominence along the right heart border is probably related to vascular crowding due to shallow inspiration.  No corresponding changes are demonstrated on the lateral view.  No pneumothorax.  Surgical clips in the right upper quadrant.  IMPRESSION: Shallow inspiration.  No evidence of active pulmonary  disease.   Original Report Authenticated By: Burman Nieves, M.D.   Dg Hip Complete Left  07/19/2012   *RADIOLOGY REPORT*   Clinical Data: Sickle cell anemia, left hip and groin pain, difficulty with flexion and abduction  LEFT HIP - COMPLETE 2+ VIEW  Comparison: None.  Findings: Symmetric hip joints. Osseous mineralization grossly normal for technique. No acute fracture, dislocation or bone destruction. Small pelvic phleboliths noted.  IMPRESSION: No definite acute osseous abnormalities.   Original Report Authenticated By: Ulyses Southward, M.D.   Ct Angio Chest Pe W/cm &/or Wo Cm  07/16/2012   *RADIOLOGY REPORT*  Clinical Data: Chest pain and tachycardia.  Sickle cell pain. History of hypertension and asthma.  White cell count 13.4. Nonsmoker.  CT ANGIOGRAPHY CHEST  Technique:  Multidetector CT imaging of the chest using the standard protocol during bolus administration of intravenous contrast. Multiplanar reconstructed images including MIPs were obtained and reviewed to evaluate the vascular anatomy.  Contrast: OMNIPAQUE IOHEXOL 350 MG/ML SOLN  Comparison: None.  Findings: Technically adequate study with good opacification of the central and segmental pulmonary arteries.  No focal filling defects are demonstrated.  No evidence of significant pulmonary embolus.  Normal caliber thoracic aorta without evidence of dissection. Cardiac enlargement.  The esophagus is fluid-filled and distended which may suggest reflux or dysmotility.  No significant lymphadenopathy in the chest.  No pleural effusions. Diffusely scattered patchy atelectasis or infiltration in the lungs. No pneumothorax.  Airways appear patent.  Thoracic vertebrae appear intact without displacement or compression.  Visualized portions of the upper abdominal organs demonstrate atrophic spleen with increased density.  Small cyst in the spleen. Changes are consistent with sickle cell change.  IMPRESSION: No evidence of significant pulmonary embolus.  Fluid filled mildly distended esophagus may represent reflux or dysmotility.  Patchy distribution of areas of atelectasis or  infiltration in the lungs.   Original Report Authenticated By: Burman Nieves, M.D.   Mr Hip Left Wo Contrast  07/20/2012   *RADIOLOGY REPORT*  Clinical Data: Hip pain.  Sickle cell anemia.  MRI OF THE LEFT HIP WITHOUT CONTRAST  Technique:  Multiplanar, multisequence MR imaging was performed. No intravenous contrast was administered.  Comparison: Radiographs dated 07/19/2012  Findings: There are infarcts of both sacral alae.  There is also a bone infarct of the posterior tip of the right greater trochanter as well as at the anterior medial aspects of both acetabuli.  There is diffuse reactivation of the red marrow in all of the visualized bones.  There is a small right hip effusion.  There is a physiologic amount of fluid in the left hip.  No evidence of avascular necrosis.  Slight nonspecific subcutaneous edema in the lateral aspect of each buttock superficial to the gluteal muscles.  Slight presacral edema is most likely a secondary reaction due to the sacral infarcts.  IMPRESSION:  1.  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 3.  Small infarct of the tip of the right greater trochanter. 4.  No evidence of avascular necrosis of the femoral heads. 5.  Small right hip effusion.   Original Report Authenticated By: Francene Boyers, M.D.    Scheduled Meds: . azithromycin  500 mg Intravenous Q24H  . fluconazole (DIFLUCAN) IV  200 mg Intravenous Once   Followed by  . [START ON 07/21/2012] fluconazole (DIFLUCAN) IV  100 mg Intravenous Q24H  . folic acid  1 mg Oral Daily  . imipenem-cilastatin  500 mg Intravenous Q8H  . lisinopril  10 mg Oral Daily  . morphine  15 mg Oral Q12H  . polyethylene glycol  17 g Oral Daily  . sodium chloride  3 mL Intravenous Q12H  . vancomycin  1,250 mg Intravenous Q8H   Continuous Infusions: . sodium chloride 100 mL/hr at 07/20/12 1016    Total time 45 minutes

## 2012-07-20 NOTE — Consult Note (Signed)
[facility]    Date of Admission:  07/16/2012  Date of Consult:  07/20/2012  Reason for Consult: FUO Referring Physician: Dr. Ashley Royalty  HPI: Cameron [name] is an 22 y.o. male with hx of Sickle cell anemia and last admission for sickle cell crisis at [facility] > 1 year ago with acute onset of LBP, chest pain malaise, presented to the ED on 07/16/12. His admission CXR negative and CT scan showed patchy atelectasis, but hethere was concern for CAP and he was started on ceftriazone and azithromycin, then changed to levaquin x 2 days when he became febrile to 103 on the 15th. Repeat blood cultures drawn, sputum culture taken --> growing Candida, repeat CXR showed Left sided infiltrate and RML opacities, shown now also on 2 V cxr. He has been broadened with vancomycin, imipenem and azithro (levaquin dc'd) now fluconazole added. He has had lower back pain and hip pain and MRI of hips done which showed:  MRI  IMPRESSION:  1. Bilateral sacral alae bone infarcts.  2. Bilateral ischial infarcts.  3. Small infarct of the tip of the right greater trochanter.  4. No evidence of avascular necrosis of the femoral heads.  5. Small right hip effusion.       Past Medical History  Diagnosis Date  . Sickle cell anemia   . Hypertension   . Asthma     Past Surgical History  Procedure Laterality Date  . Cholecystectomy    ergies:   No Known Allergies   Medications: I have reviewed patients current medications as documented in Epic Anti-infectives   Start     Dose/Rate Route Frequency Ordered Stop   07/21/12 1400  fluconazole (DIFLUCAN) IVPB 100 mg     100 mg 50 mL/hr over 60 Minutes Intravenous Every 24 hours 07/20/12 1202     07/20/12 1300  fluconazole (DIFLUCAN) IVPB 200 mg     200 mg 100 mL/hr over 60 Minutes Intravenous  Once 07/20/12 1202 07/20/12 1424   07/20/12 0600  vancomycin (VANCOCIN) 1,250 mg in sodium chloride 0.9 % 250 mL IVPB  Status:  Discontinued     1,250 mg 166.7 mL/hr over 90 Minutes Intravenous Every 8 hours 07/19/12 2149 07/20/12 1440   07/19/12 2200  vancomycin (VANCOCIN) 500 mg in sodium chloride 0.9 % 100 mL IVPB     500 mg 100 mL/hr over 60 Minutes Intravenous  Once 07/19/12 2146 07/19/12 2352   07/18/12 1200  imipenem-cilastatin (PRIMAXIN) 500 mg in sodium chloride 0.9 % 100 mL IVPB     500 mg 200 mL/hr over 30 Minutes Intravenous 3 times per day 07/18/12 0928     07/18/12 1200  vancomycin (VANCOCIN) IVPB 750 mg/150 ml premix  Status:  Discontinued     750 mg 150 mL/hr over 60 Minutes Intravenous Every 8 hours 07/18/12 1023 07/19/12 2149   07/18/12 1000  azithromycin (ZITHROMAX) 500 mg in dextrose 5 % 250 mL IVPB  Status:  Discontinued     500 mg 250 mL/hr over 60 Minutes Intravenous Every 24 hours 07/18/12 0928 07/20/12 1418   07/16/12 1400  levofloxacin (LEVAQUIN) IVPB 750 mg  Status:  Discontinued     750 mg 100 mL/hr over 90 Minutes Intravenous Every 24 hours 07/16/12 1128 07/18/12 0922   07/16/12 0445  cefTRIAXone (ROCEPHIN) 2 g in dextrose 5 % 50 mL IVPB     2 g 100 mL/hr over 30 Minutes Intravenous  Once 07/16/12 0436 07/16/12 0731   07/16/12  0445  azithromycin (ZITHROMAX) 500 mg in dextrose 5 % 250 mL IVPB     500 mg 250 mL/hr over 60 Minutes Intravenous  Once 07/16/12 0436 07/16/12 5366      Family Hx; both parents with sickle cell trait, mother with HTN, no hx of CTD in family members  Social History:  reports that he has never smoked. He has never used smokeless tobacco. He reports that he does not drink alcohol or use illicit drugs. not beensexually active     As in HPI and primary teams notes otherwise 12 point review of systems is negative  Blood pressure 127/65, pulse 108, temperature 98.7 F (37.1 C), temperature source Oral, resp. rate 20, height 5\' 5"  (1.651 m), weight 130 lb (58.968 kg), SpO2 99.00%. General: Alert and awake, oriented x3, not in any acute distress. HEENT: anicteric sclera, pupils  reactive to light and accommodation, EOMI, oropharynx with coating on tongue  CVS  Tachy regular rate, normal r,  no murmur rubs or gallops Chest: rhonchi right side, Abdomen: soft nontender, nondistended, normal bowel sounds, MSK: no pain around left or right hip, no pain with EXT, internal rotation, no knee effusions, he has minimal shoulder pain on the right where he has hardware, no tenderness left shoulder, no vertebral palpable abnormalities Extremities: no  clubbing or edema noted bilaterally Skin: no rashes Neuro: nonfocal, strength and sensation intact   Results for orders placed during the hospital encounter of 07/16/12 (from the past 48 hour(s))  HEMOGLOBIN AND HEMATOCRIT, BLOOD     Status: Abnormal   Collection Time    07/18/12  6:31 PM      Result Value Range   Hemoglobin 7.3 (*) 13.0 - 17.0 g/dL   HCT 44.0 (*) 34.7 - 42.5 %  COMPREHENSIVE METABOLIC PANEL     Status: Abnormal   Collection Time    07/19/12  4:32 AM      Result Value Range   Sodium 132 (*) 135 - 145 mEq/L   Potassium 4.2  3.5 - 5.1 mEq/L   Chloride 96  96 - 112 mEq/L   CO2 31  19 - 32 mEq/L   Glucose, Bld 116 (*) 70 - 99 mg/dL   BUN 7  6 - 23 mg/dL   Creatinine, Ser 9.56  0.50 - 1.35 mg/dL   Calcium 8.6  8.4 - 38.7 mg/dL   Total Protein 6.6  6.0 - 8.3 g/dL   Albumin 3.1 (*) 3.5 - 5.2 g/dL   AST 47 (*) 0 - 37 U/L   ALT 26  0 - 53 U/L   Alkaline Phosphatase 80  39 - 117 U/L   Total Bilirubin 6.0 (*) 0.3 - 1.2 mg/dL   GFR calc non Af Amer >90  >90 mL/min   GFR calc Af Amer >90  >90 mL/min   Comment:            The eGFR has been calculated     using the CKD EPI equation.     This calculation has not been     validated in all clinical     situations.     eGFR's persistently     <90 mL/min signify     possible Chronic Kidney Disease.  CBC     Status: Abnormal   Collection Time    07/19/12  4:32 AM      Result Value Range   WBC 18.9 (*) 4.0 - 10.5 K/uL   RBC 2.03 (*) 4.22 - 5.81  MIL/uL    Hemoglobin 6.9 (*) 13.0 - 17.0 g/dL   Comment: REPEATED TO VERIFY     CRITICAL RESULT CALLED TO, READ BACK BY AND VERIFIED WITH:     SAULKS,K/4W @0510  ON 07/19/12 BY KARCZEWSKI,S.   HCT 18.7 (*) 39.0 - 52.0 %   MCV 92.1  78.0 - 100.0 fL   MCH 34.0  26.0 - 34.0 pg   MCHC 36.9 (*) 30.0 - 36.0 g/dL   RDW 09.8 (*) 11.9 - 14.7 %   Platelets 100 (*) 150 - 400 K/uL   Comment: CONSISTENT WITH PREVIOUS RESULT  PREPARE RBC (CROSSMATCH)     Status: None   Collection Time    07/19/12  6:00 AM      Result Value Range   Order Confirmation ORDER PROCESSED BY BLOOD BANK    VANCOMYCIN, TROUGH     Status: Abnormal   Collection Time    07/19/12  7:00 PM      Result Value Range   Vancomycin Tr 6.7 (*) 10.0 - 20.0 ug/mL  COMPREHENSIVE METABOLIC PANEL     Status: Abnormal   Collection Time    07/20/12  5:00 AM      Result Value Range   Sodium 134 (*) 135 - 145 mEq/L   Potassium 3.6  3.5 - 5.1 mEq/L   Chloride 97  96 - 112 mEq/L   CO2 30  19 - 32 mEq/L   Glucose, Bld 106 (*) 70 - 99 mg/dL   BUN 9  6 - 23 mg/dL   Creatinine, Ser 8.29  0.50 - 1.35 mg/dL   Calcium 8.5  8.4 - 56.2 mg/dL   Total Protein 6.5  6.0 - 8.3 g/dL   Albumin 3.0 (*) 3.5 - 5.2 g/dL   AST 39 (*) 0 - 37 U/L   ALT 26  0 - 53 U/L   Alkaline Phosphatase 71  39 - 117 U/L   Total Bilirubin 5.5 (*) 0.3 - 1.2 mg/dL   GFR calc non Af Amer >90  >90 mL/min   GFR calc Af Amer >90  >90 mL/min   Comment:            The eGFR has been calculated     using the CKD EPI equation.     This calculation has not been     validated in all clinical     situations.     eGFR's persistently     <90 mL/min signify     possible Chronic Kidney Disease.  CBC WITH DIFFERENTIAL     Status: Abnormal   Collection Time    07/20/12  5:00 AM      Result Value Range   WBC 16.2 (*) 4.0 - 10.5 K/uL   Comment: WHITE COUNT CONFIRMED ON SMEAR     RARE NRBCs   RBC 1.86 (*) 4.22 - 5.81 MIL/uL   Hemoglobin 6.2 (*) 13.0 - 17.0 g/dL   Comment: REPEATED TO VERIFY      CRITICAL VALUE NOTED.  VALUE IS CONSISTENT WITH PREVIOUSLY REPORTED AND CALLED VALUE.   HCT 17.4 (*) 39.0 - 52.0 %   MCV 93.5  78.0 - 100.0 fL   MCH 33.3  26.0 - 34.0 pg   MCHC 35.6  30.0 - 36.0 g/dL   RDW 13.0 (*) 86.5 - 78.4 %   Platelets 105 (*) 150 - 400 K/uL   Neutrophils Relative % 70  43 - 77 %   Lymphocytes Relative 12  12 - 46 %  Monocytes Relative 18 (*) 3 - 12 %   Eosinophils Relative 0  0 - 5 %   Basophils Relative 0  0 - 1 %   Neutro Abs 11.4 (*) 1.7 - 7.7 K/uL   Lymphs Abs 1.9  0.7 - 4.0 K/uL   Monocytes Absolute 2.9 (*) 0.1 - 1.0 K/uL   Eosinophils Absolute 0.0  0.0 - 0.7 K/uL   Basophils Absolute 0.0  0.0 - 0.1 K/uL   RBC Morphology POLYCHROMASIA PRESENT     Comment: TARGET CELLS     HOWELL/JOLLY BODIES     SICKLE CELLS   WBC Morphology MILD LEFT SHIFT (1-5% METAS, OCC MYELO, OCC BANDS)     Smear Review LARGE PLATELETS PRESENT    URINALYSIS, ROUTINE W REFLEX MICROSCOPIC     Status: Abnormal   Collection Time    07/20/12  7:37 AM      Result Value Range   Color, Urine AMBER (*) YELLOW   Comment: BIOCHEMICALS MAY BE AFFECTED BY COLOR   APPearance CLEAR  CLEAR   Specific Gravity, Urine 1.017  1.005 - 1.030   pH 7.0  5.0 - 8.0   Glucose, UA NEGATIVE  NEGATIVE mg/dL   Hgb urine dipstick NEGATIVE  NEGATIVE   Bilirubin Urine NEGATIVE  NEGATIVE   Ketones, ur 15 (*) NEGATIVE mg/dL   Protein, ur 30 (*) NEGATIVE mg/dL   Urobilinogen, UA 4.0 (*) 0.0 - 1.0 mg/dL   Nitrite NEGATIVE  NEGATIVE   Leukocytes, UA NEGATIVE  NEGATIVE  URINE MICROSCOPIC-ADD ON     Status: Abnormal   Collection Time    07/20/12  7:37 AM      Result Value Range   Squamous Epithelial / LPF FEW (*) RARE   Bacteria, UA MANY (*) RARE      Component Value Date/Time   SDES SPUTUM 07/18/2012 1331   SDES SPU 07/18/2012 1331   SPECREQUEST NONE 07/18/2012 1331   SPECREQUEST NONE 07/18/2012 1331   CULT FEW CANDIDA ALBICANS 07/18/2012 1331   REPTSTATUS 07/18/2012 FINAL 07/18/2012 1331   REPTSTATUS  07/20/2012 FINAL 07/18/2012 1331   Dg Chest 2 View  07/20/2012   *RADIOLOGY REPORT*  Clinical Data: Cough and fever.  CHEST - 2 VIEW  Comparison: 07/18/2012  Findings: Two views of the chest again demonstrate patchy airspace densities in the left mid and lower lung regions.  Persistent densities in the right mid and right perihilar region.  Heart size is stable.  The distribution of lung disease has not significantly changed.  Heart size is stable.  IMPRESSION: Minimal change in the bilateral airspace disease.  Findings are concerning for pneumonia.   Original Report Authenticated By: Richarda Overlie, M.D.   Dg Hip Complete Left  07/19/2012   *RADIOLOGY REPORT*  Clinical Data: Sickle cell anemia, left hip and groin pain, difficulty with flexion and abduction  LEFT HIP - COMPLETE 2+ VIEW  Comparison: None.  Findings: Symmetric hip joints. Osseous mineralization grossly normal for technique. No acute fracture, dislocation or bone destruction. Small pelvic phleboliths noted.  IMPRESSION: No definite acute osseous abnormalities.   Original Report Authenticated By: Ulyses Southward, M.D.   Mr Hip Left Wo Contrast  07/20/2012   *RADIOLOGY REPORT*  Clinical Data: Hip pain.  Sickle cell anemia.  MRI OF THE LEFT HIP WITHOUT CONTRAST  Technique:  Multiplanar, multisequence MR imaging was performed. No intravenous contrast was administered.  Comparison: Radiographs dated 07/19/2012  Findings: There are infarcts of both sacral alae.  There is also a  bone infarct of the posterior tip of the right greater trochanter as well as at the anterior medial aspects of both acetabuli.  There is diffuse reactivation of the red marrow in all of the visualized bones.  There is a small right hip effusion.  There is a physiologic amount of fluid in the left hip.  No evidence of avascular necrosis.  Slight nonspecific subcutaneous edema in the lateral aspect of each buttock superficial to the gluteal muscles.  Slight presacral edema is most likely  a secondary reaction due to the sacral infarcts.  IMPRESSION:  1.  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 3.  Small infarct of the tip of the right greater trochanter. 4.  No evidence of avascular necrosis of the femoral heads. 5.  Small right hip effusion.   Original Report Authenticated By: Francene Boyers, M.D.     Recent Results (from the past 720 hour(s))  CULTURE, BLOOD (ROUTINE X 2)     Status: None   Collection Time    07/18/12  9:01 AM      Result Value Range Status   Specimen Description BLOOD LEFT ARM   Final   Special Requests     Final   Value: BOTTLES DRAWN AEROBIC AND ANAEROBIC 7.5 CC BOTH BOTTLES   Culture  Setup Time 07/18/2012 17:07   Final   Culture     Final   Value:        BLOOD CULTURE RECEIVED NO GROWTH TO DATE CULTURE WILL BE HELD FOR 5 DAYS BEFORE ISSUING A FINAL NEGATIVE REPORT   Report Status PENDING   Incomplete  CULTURE, BLOOD (ROUTINE X 2)     Status: None   Collection Time    07/18/12  9:08 AM      Result Value Range Status   Specimen Description BLOOD LEFT HAND   Final   Special Requests     Final   Value: BOTTLES DRAWN AEROBIC AND ANAEROBIC 4.5CC BOTH BOTTLES   Culture  Setup Time 07/18/2012 17:07   Final   Culture     Final   Value:        BLOOD CULTURE RECEIVED NO GROWTH TO DATE CULTURE WILL BE HELD FOR 5 DAYS BEFORE ISSUING A FINAL NEGATIVE REPORT   Report Status PENDING   Incomplete  CULTURE, EXPECTORATED SPUTUM-ASSESSMENT     Status: None   Collection Time    07/18/12  1:31 PM      Result Value Range Status   Specimen Description SPUTUM   Final   Special Requests NONE   Final   Sputum evaluation     Final   Value: THIS SPECIMEN IS ACCEPTABLE. RESPIRATORY CULTURE REPORT TO FOLLOW.   Report Status 07/18/2012 FINAL   Final  CULTURE, RESPIRATORY (NON-EXPECTORATED)     Status: None   Collection Time    07/18/12  1:31 PM      Result Value Range Status   Specimen Description SPU   Final   Special Requests NONE   Final    Gram Stain     Final   Value: FEW WBC PRESENT, PREDOMINANTLY PMN     RARE SQUAMOUS EPITHELIAL CELLS PRESENT     NO ORGANISMS SEEN   Culture FEW CANDIDA ALBICANS   Final   Report Status 07/20/2012 FINAL   Final     Impression/Recommendation  22 year old admitted with sickle cell crisis, who was treated also for CAP on admission though his CXR and CT  were underwhelming with fever onset while in the hospital and progressive infiltrates RML Left mid LLung broadened to vanco, imipenem, and now fluconazole also with hip effusion   #1 FUO: Likely either due to HCAP vs sickle cell crisis itself vs other more occult infection such as septic hip or shoulder infection --dc azithromycin --continue vancomycin and imipenem for now --fu blood and sputum cx,  Candida in sputum is not a pathogen  #2 HCAP: Fever curve improved post vanco.imipenem and he is on day #3 of these abx. NO further need for "atypical coverage" Legionella ag negative --continue imi/vanco  #3 Thrush: has coating on tongue and some dysphagia may be developing thrush here: --would continue fluconazole but use po  #4 Hip effusion: not obviously infected though  If he fevers again thru current abx or has more pain there may need to consider IR guided aspiration for cell count and diff, crystals and culture  #5 shoulder pain: site of hardware; if fevers again without explanation may need to image this area  #6 Screening: will check HIV    Thank you so much for this interesting consult  [facility] [phone] (pager) [phone] (office) 07/20/2012, 5:05 PM  Paulette Blanch Dam 07/20/2012, 5:05 PM

## 2012-07-21 LAB — HEPATITIS PANEL, ACUTE
HCV Ab: NEGATIVE
Hep A IgM: NEGATIVE

## 2012-07-21 MED ORDER — MAGNESIUM CITRATE PO SOLN
1.0000 | Freq: Once | ORAL | Status: AC
  Administered 2012-07-21: 1 via ORAL

## 2012-07-21 MED ORDER — SODIUM CHLORIDE 0.9 % IJ SOLN
10.0000 mL | INTRAMUSCULAR | Status: DC | PRN
  Administered 2012-07-22 – 2012-07-24 (×4): 10 mL

## 2012-07-21 NOTE — Progress Notes (Signed)
Subjective: Patient is still having fever with a temperature of 101.1 today however the curve is better than what it was. He was having some pain last night that wake pain medications rated at 6/10. He normally does not require much of pain medications at this point. His hip pain is also better he was able to walk slightly. He is slightly short of breath. Complaining of pain from numerous blood draws and IV lines. He denies any chest pain, no nausea vomiting or diarrhea. Patient has not had any bowel movement since June 13th.  Objective: Vital signs in last 24 hours: Temp:  [98.7 F (37.1 C)-102.5 F (39.2 C)] 101.1 F (38.4 C) (06/18 0711) Pulse Rate:  [105-116] 116 (06/18 0623) Resp:  [18-20] 18 (06/18 0623) BP: (125-134)/(62-79) 134/79 mmHg (06/18 0623) SpO2:  [99 %-100 %] 100 % (06/18 1610) Weight change:  Last BM Date: 07/15/12  Intake/Output from previous day: 06/17 0701 - 06/18 0700 In: 4310 [P.O.:960; I.V.:2400; IV Piggyback:950] Out: 3200 [Urine:3200] Intake/Output this shift: Total I/O In: 200 [IV Piggyback:200] Out: 400 [Urine:400]  General appearance: alert, cooperative and mild distress Eyes: conjunctivae/corneas clear. PERRL, EOM's intact. Fundi benign. Throat: abnormal findings: thrush Neck: no adenopathy, no carotid bruit, no JVD, supple, symmetrical, trachea midline and thyroid not enlarged, symmetric, no tenderness/mass/nodules Resp: rales bilaterally and rhonchi bibasilar Chest wall: no tenderness Cardio: regular rate and rhythm, S1, S2 normal, no murmur, click, rub or gallop GI: soft, non-tender; bowel sounds normal; no masses,  no organomegaly Extremities: extremities normal, atraumatic, no cyanosis or edema Pulses: 2+ and symmetric Skin: Skin color, texture, turgor normal. No rashes or lesions Neurologic: Grossly normal  Lab Results:  Recent Labs  07/19/12 0432 07/20/12 0500  WBC 18.9* 16.2*  HGB 6.9* 6.2*  HCT 18.7* 17.4*  PLT 100* 105*    BMET  Recent Labs  07/19/12 0432 07/20/12 0500  NA 132* 134*  K 4.2 3.6  CL 96 97  CO2 31 30  GLUCOSE 116* 106*  BUN 7 9  CREATININE 0.59 0.50  CALCIUM 8.6 8.5    Studies/Results: Dg Chest 2 View  07/20/2012   *RADIOLOGY REPORT*  Clinical Data: Cough and fever.  CHEST - 2 VIEW  Comparison: 07/18/2012  Findings: Two views of the chest again demonstrate patchy airspace densities in the left mid and lower lung regions.  Persistent densities in the right mid and right perihilar region.  Heart size is stable.  The distribution of lung disease has not significantly changed.  Heart size is stable.  IMPRESSION: Minimal change in the bilateral airspace disease.  Findings are concerning for pneumonia.   Original Report Authenticated By: Richarda Overlie, M.D.   Dg Hip Complete Left  07/19/2012   *RADIOLOGY REPORT*  Clinical Data: Sickle cell anemia, left hip and groin pain, difficulty with flexion and abduction  LEFT HIP - COMPLETE 2+ VIEW  Comparison: None.  Findings: Symmetric hip joints. Osseous mineralization grossly normal for technique. No acute fracture, dislocation or bone destruction. Small pelvic phleboliths noted.  IMPRESSION: No definite acute osseous abnormalities.   Original Report Authenticated By: Ulyses Southward, M.D.   Mr Hip Left Wo Contrast  07/20/2012   *RADIOLOGY REPORT*  Clinical Data: Hip pain.  Sickle cell anemia.  MRI OF THE LEFT HIP WITHOUT CONTRAST  Technique:  Multiplanar, multisequence MR imaging was performed. No intravenous contrast was administered.  Comparison: Radiographs dated 07/19/2012  Findings: There are infarcts of both sacral alae.  There is also a bone infarct of the  posterior tip of the right greater trochanter as well as at the anterior medial aspects of both acetabuli.  There is diffuse reactivation of the red marrow in all of the visualized bones.  There is a small right hip effusion.  There is a physiologic amount of fluid in the left hip.  No evidence of  avascular necrosis.  Slight nonspecific subcutaneous edema in the lateral aspect of each buttock superficial to the gluteal muscles.  Slight presacral edema is most likely a secondary reaction due to the sacral infarcts.  IMPRESSION:  1.  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 3.  Small infarct of the tip of the right greater trochanter. 4.  No evidence of avascular necrosis of the femoral heads. 5.  Small right hip effusion.   Original Report Authenticated By: Francene Boyers, M.D.    Medications: I have reviewed the patient's current medications.  Assessment/Plan: 22 year old gentleman with community-acquired pneumonia and sickle cell painful crisis now with fever, left hip pain oral thrush.  #1 left hip pain: Much improved. MRI showed multiple infarcts in the area of his sacrum as well as greater trochanter and femoral heads. This also effusion in the hip joint. This is worrisome for septic arthritis however patient is doing better so we'll continue current treatment and consider PTOT if he so walking on his own.  #2 sickle cell painful crisis: Continue his current treatment. No change in his regimen.  #3 community-acquired pneumonia: Patient's fever curve has come down but still febrile. Appreciate infectious disease input. We'll continue his current treatment until patient's infection is controlled. Cultures are still pending but he is on broad-spectrum coverage with Vancomycin and imipenem.  #4 leukocytosis: Has been improving. We'll recheck it in the morning.  #5 sickle cell anemia: His hemoglobin has dropped to 6.2 today. There is evidence of mild hemolysis. I will transfuse him one unit of packed red blood cell and recheck the level in the morning. Palpation his baseline hemoglobin is somewhere around 10g so we are far off.  #6 hypertension: Continue the lisinopril.  #7 constipation: Not responded to MiraLax. I will give him a dose of magnesium citrate and  follow with Colace if needed  #8 IV access: Due to difficulty in IV access, I will order a PICC line since patient is receiving multiple antibiotics and frequent blood draws.     LOS: 5 days    GARBA,LAWAL 07/21/2012, 10:59 AM

## 2012-07-21 NOTE — Progress Notes (Signed)
[facility]  Day # 7 antibiotics  Day # 4 vancomycin  Day # 4 imipenem  Day #  2 fluconazole  Received 2 days of levaquin, 3 days of azithromycin, 1 day of ceftriaxone  Subjective: Had lower back pain again last night, fever again left wrist pain where he believe nerve was struck with PIV insertion   Antibiotics:  Anti-infectives   Start     Dose/Rate Route Frequency Ordered Stop   07/21/12 1400  fluconazole (DIFLUCAN) IVPB 100 mg  Status:  Discontinued     100 mg 50 mL/hr over 60 Minutes Intravenous Every 24 hours 07/20/12 1202 07/20/12 1753   07/20/12 1830  fluconazole (DIFLUCAN) tablet 100 mg     100 mg Oral Daily 07/20/12 1753     07/20/12 1800  vancomycin (VANCOCIN) 1,250 mg in sodium chloride 0.9 % 250 mL IVPB     1,250 mg 166.7 mL/hr over 90 Minutes Intravenous Every 8 hours 07/20/12 1722     07/20/12 1300  fluconazole (DIFLUCAN) IVPB 200 mg     200 mg 100 mL/hr over 60 Minutes Intravenous  Once 07/20/12 1202 07/20/12 1424   07/20/12 0600  vancomycin (VANCOCIN) 1,250 mg in sodium chloride 0.9 % 250 mL IVPB  Status:  Discontinued     1,250 mg 166.7 mL/hr over 90 Minutes Intravenous Every 8 hours 07/19/12 2149 07/20/12 1440   07/19/12 2200  vancomycin (VANCOCIN) 500 mg in sodium chloride 0.9 % 100 mL IVPB     500 mg 100 mL/hr over 60 Minutes Intravenous  Once 07/19/12 2146 07/19/12 2352   07/18/12 1200  imipenem-cilastatin (PRIMAXIN) 500 mg in sodium chloride 0.9 % 100 mL IVPB     500 mg 200 mL/hr over 30 Minutes Intravenous 3 times per day 07/18/12 0928     07/18/12 1200  vancomycin (VANCOCIN) IVPB 750 mg/150 ml premix  Status:  Discontinued     750 mg 150 mL/hr over 60 Minutes Intravenous Every 8 hours 07/18/12 1023 07/19/12 2149   07/18/12 1000  azithromycin (ZITHROMAX) 500 mg in dextrose 5 % 250 mL IVPB  Status:  Discontinued     500 mg 250 mL/hr over 60 Minutes Intravenous Every 24 hours 07/18/12 0928 07/20/12 1418   07/16/12 1400   levofloxacin (LEVAQUIN) IVPB 750 mg  Status:  Discontinued     750 mg 100 mL/hr over 90 Minutes Intravenous Every 24 hours 07/16/12 1128 07/18/12 0922   07/16/12 0445  cefTRIAXone (ROCEPHIN) 2 g in dextrose 5 % 50 mL IVPB     2 g 100 mL/hr over 30 Minutes Intravenous  Once 07/16/12 0436 07/16/12 0731   07/16/12 0445  azithromycin (ZITHROMAX) 500 mg in dextrose 5 % 250 mL IVPB     500 mg 250 mL/hr over 60 Minutes Intravenous  Once 07/16/12 0436 07/16/12 0625      Medications: Scheduled Meds: . fluconazole  100 mg Oral Daily  . folic acid  1 mg Oral Daily  . imipenem-cilastatin  500 mg Intravenous Q8H  . lisinopril  10 mg Oral Daily  . morphine  15 mg Oral Q12H  . polyethylene glycol  17 g Oral Daily  . sodium chloride  3 mL Intravenous Q12H  . vancomycin  1,250 mg Intravenous Q8H   Continuous Infusions: . sodium chloride 1,000 mL (07/21/12 0111)   PRN Meds:.sodium chloride, acetaminophen, albuterol, HYDROcodone-acetaminophen, HYDROmorphone (DILAUDID) injection, ibuprofen, ipratropium, naloxone, ondansetron (ZOFRAN) IV, ondansetron, senna, sodium chloride, sodium chloride, sodium chloride  Objective: Weight change:   Intake/Output Summary (Last 24 hours) at 07/21/12 1532 Last data filed at 07/21/12 1500  Gross per 24 hour  Intake   4310 ml  Output   3025 ml  Net   1285 ml   Blood pressure 134/79, pulse 116, temperature 98.3 F (36.8 C), temperature source Oral, resp. rate 18, height 5\' 5"  (1.651 m), weight 130 lb (58.968 kg), SpO2 100.00%. Temp:  [98.3 F (36.8 C)-102.5 F (39.2 C)] 98.3 F (36.8 C) (06/18 1100) Pulse Rate:  [105-116] 116 (06/18 0623) Resp:  [18-20] 18 (06/18 0623) BP: (125-134)/(62-79) 134/79 mmHg (06/18 0623) SpO2:  [99 %-100 %] 100 % (06/18 1191)  Physical Exam: General: Alert and awake, oriented x3, not in any acute distress.  HEENT: anicteric sclera, pupils reactive to light and accommodation, EOMI, oropharynx with coating on tongue  CVS Tachy  regular rate, normal r, no murmur rubs or gallops  Chest: rhonchi right side,  Abdomen: soft nontender, nondistended, normal bowel sounds,  MSK: no pain around left or right hip, no pain with EXT, internal rotation, no knee effusions, he has minimal shoulder pain on the right where he has hardware, no tenderness left shoulder, no vertebral palpable abnormalities  Extremities: no clubbing or edema noted bilaterally  Skin: no rashes  Neuro: nonfocal, strength and sensation intact   Lab Results:  Recent Labs  07/19/12 0432 07/20/12 0500  WBC 18.9* 16.2*  HGB 6.9* 6.2*  HCT 18.7* 17.4*  PLT 100* 105*    BMET  Recent Labs  07/19/12 0432 07/20/12 0500  NA 132* 134*  K 4.2 3.6  CL 96 97  CO2 31 30  GLUCOSE 116* 106*  BUN 7 9  CREATININE 0.59 0.50  CALCIUM 8.6 8.5    Micro Results: Recent Results (from the past 240 hour(s))  CULTURE, BLOOD (ROUTINE X 2)     Status: None   Collection Time    07/18/12  9:01 AM      Result Value Range Status   Specimen Description BLOOD LEFT ARM   Final   Special Requests     Final   Value: BOTTLES DRAWN AEROBIC AND ANAEROBIC 7.5 CC BOTH BOTTLES   Culture  Setup Time 07/18/2012 17:07   Final   Culture     Final   Value:        BLOOD CULTURE RECEIVED NO GROWTH TO DATE CULTURE WILL BE HELD FOR 5 DAYS BEFORE ISSUING A FINAL NEGATIVE REPORT   Report Status PENDING   Incomplete  CULTURE, BLOOD (ROUTINE X 2)     Status: None   Collection Time    07/18/12  9:08 AM      Result Value Range Status   Specimen Description BLOOD LEFT HAND   Final   Special Requests     Final   Value: BOTTLES DRAWN AEROBIC AND ANAEROBIC 4.5CC BOTH BOTTLES   Culture  Setup Time 07/18/2012 17:07   Final   Culture     Final   Value:        BLOOD CULTURE RECEIVED NO GROWTH TO DATE CULTURE WILL BE HELD FOR 5 DAYS BEFORE ISSUING A FINAL NEGATIVE REPORT   Report Status PENDING   Incomplete  CULTURE, EXPECTORATED SPUTUM-ASSESSMENT     Status: None   Collection Time     07/18/12  1:31 PM      Result Value Range Status   Specimen Description SPUTUM   Final   Special Requests NONE   Final  Sputum evaluation     Final   Value: THIS SPECIMEN IS ACCEPTABLE. RESPIRATORY CULTURE REPORT TO FOLLOW.   Report Status 07/18/2012 FINAL   Final  CULTURE, RESPIRATORY (NON-EXPECTORATED)     Status: None   Collection Time    07/18/12  1:31 PM      Result Value Range Status   Specimen Description SPU   Final   Special Requests NONE   Final   Gram Stain     Final   Value: FEW WBC PRESENT, PREDOMINANTLY PMN     RARE SQUAMOUS EPITHELIAL CELLS PRESENT     NO ORGANISMS SEEN   Culture FEW CANDIDA ALBICANS   Final   Report Status 07/20/2012 FINAL   Final    Studies/Results: Dg Chest 2 View  07/20/2012   *RADIOLOGY REPORT*  Clinical Data: Cough and fever.  CHEST - 2 VIEW  Comparison: 07/18/2012  Findings: Two views of the chest again demonstrate patchy airspace densities in the left mid and lower lung regions.  Persistent densities in the right mid and right perihilar region.  Heart size is stable.  The distribution of lung disease has not significantly changed.  Heart size is stable.  IMPRESSION: Minimal change in the bilateral airspace disease.  Findings are concerning for pneumonia.   Original Report Authenticated By: Richarda Overlie, M.D.   Mr Hip Left Wo Contrast  07/20/2012   *RADIOLOGY REPORT*  Clinical Data: Hip pain.  Sickle cell anemia.  MRI OF THE LEFT HIP WITHOUT CONTRAST  Technique:  Multiplanar, multisequence MR imaging was performed. No intravenous contrast was administered.  Comparison: Radiographs dated 07/19/2012  Findings: There are infarcts of both sacral alae.  There is also a bone infarct of the posterior tip of the right greater trochanter as well as at the anterior medial aspects of both acetabuli.  There is diffuse reactivation of the red marrow in all of the visualized bones.  There is a small right hip effusion.  There is a physiologic amount of fluid in the  left hip.  No evidence of avascular necrosis.  Slight nonspecific subcutaneous edema in the lateral aspect of each buttock superficial to the gluteal muscles.  Slight presacral edema is most likely a secondary reaction due to the sacral infarcts.  IMPRESSION:  1.  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 3.  Small infarct of the tip of the right greater trochanter. 4.  No evidence of avascular necrosis of the femoral heads. 5.  Small right hip effusion.   Original Report Authenticated By: Francene Boyers, M.D.      Assessment/Plan: Cameron [name] is a 22 y.o. male  admitted with sickle cell crisis, who was treated also for CAP on admission though his CXR and CT were underwhelming with fever onset while in the hospital and progressive infiltrates RML Left mid LLung broadened to vanco, imipenem, and now fluconazole also with hip effusion   #1 FUO: Likely either due to HCAP vs sickle cell crisis itself vs other more occult infection such as septic hip or shoulder infection. He fevered yet again overnight.  --continue vancomycin and imipenem for now  --fu blood  Cultures --may need to aspirate hip if still fevering but I would like to be patient given that pt has sickle cell and some of this could be due to sickle cell crisis that needs to run its course   #2 HCAP: Fever curve improved post vanco.imipenem and he is on day #4 of these abx.  --continue  imi/vanco ,  --check CXR 2 V in the am, if worsens may need bronchosopy  #3 Thrush: --continue fluconazole but use po    #4 Hip effusion: not obviously infected though If he continues to  Fever  thru current abx or has more pain there may need to consider IR guided aspiration for cell count and diff, crystals and culture   #5 shoulder pain: clarified, no hardware in this site  #6 Screening: will check HIV  #7 pain in hand: check dopplers UE     LOS: 5 days   Acey Lav 07/21/2012, 3:32 PM

## 2012-07-21 NOTE — Progress Notes (Signed)
Peripherally Inserted Central Catheter/Midline Placement  The IV Nurse has discussed with the patient and/or persons authorized to consent for the patient, the purpose of this procedure and the potential benefits and risks involved with this procedure.  The benefits include less needle sticks, lab draws from the catheter and patient may be discharged home with the catheter.  Risks include, but not limited to, infection, bleeding, blood clot (thrombus formation), and puncture of an artery; nerve damage and irregular heat beat.  Alternatives to this procedure were also discussed.  PICC/Midline Placement Documentation  PICC / Midline Double Lumen 07/21/12 PICC Right Basilic (Active)  Dressing Change Due 07/28/12 07/21/2012 12:00 PM       Franne Grip Renee 07/21/2012, 12:52 PM

## 2012-07-22 ENCOUNTER — Ambulatory Visit (HOSPITAL_COMMUNITY): Payer: BC Managed Care – PPO

## 2012-07-22 ENCOUNTER — Encounter (HOSPITAL_COMMUNITY): Payer: Self-pay | Admitting: Radiology

## 2012-07-22 ENCOUNTER — Inpatient Hospital Stay (HOSPITAL_COMMUNITY): Payer: BC Managed Care – PPO

## 2012-07-22 DIAGNOSIS — M79609 Pain in unspecified limb: Secondary | ICD-10-CM

## 2012-07-22 DIAGNOSIS — I82409 Acute embolism and thrombosis of unspecified deep veins of unspecified lower extremity: Secondary | ICD-10-CM

## 2012-07-22 DIAGNOSIS — D5700 Hb-SS disease with crisis, unspecified: Secondary | ICD-10-CM

## 2012-07-22 DIAGNOSIS — I82A19 Acute embolism and thrombosis of unspecified axillary vein: Secondary | ICD-10-CM

## 2012-07-22 LAB — TYPE AND SCREEN
ABO/RH(D): B POS
Antibody Screen: NEGATIVE
Donor AG Type: NEGATIVE

## 2012-07-22 LAB — URINALYSIS, ROUTINE W REFLEX MICROSCOPIC
Leukocytes, UA: NEGATIVE
Nitrite: NEGATIVE
Specific Gravity, Urine: 1.019 (ref 1.005–1.030)
pH: 7 (ref 5.0–8.0)

## 2012-07-22 LAB — COMPREHENSIVE METABOLIC PANEL
Albumin: 2.8 g/dL — ABNORMAL LOW (ref 3.5–5.2)
BUN: 9 mg/dL (ref 6–23)
Creatinine, Ser: 0.46 mg/dL — ABNORMAL LOW (ref 0.50–1.35)
Potassium: 4 mEq/L (ref 3.5–5.1)
Total Protein: 6.4 g/dL (ref 6.0–8.3)

## 2012-07-22 LAB — CBC WITH DIFFERENTIAL/PLATELET
Band Neutrophils: 0 % (ref 0–10)
Basophils Absolute: 0 10*3/uL (ref 0.0–0.1)
Basophils Relative: 0 % (ref 0–1)
Eosinophils Absolute: 0 10*3/uL (ref 0.0–0.7)
Eosinophils Relative: 0 % (ref 0–5)
HCT: 20.5 % — ABNORMAL LOW (ref 39.0–52.0)
Hemoglobin: 7.2 g/dL — ABNORMAL LOW (ref 13.0–17.0)
Lymphs Abs: 3.5 10*3/uL (ref 0.7–4.0)
MCH: 31.7 pg (ref 26.0–34.0)
Myelocytes: 0 %
Neutro Abs: 7.6 10*3/uL (ref 1.7–7.7)
Neutrophils Relative %: 57 % (ref 43–77)
RBC: 2.27 MIL/uL — ABNORMAL LOW (ref 4.22–5.81)

## 2012-07-22 LAB — VANCOMYCIN, TROUGH: Vancomycin Tr: 12.3 ug/mL (ref 10.0–20.0)

## 2012-07-22 MED ORDER — SODIUM CHLORIDE 0.9 % IV SOLN
1500.0000 mg | Freq: Three times a day (TID) | INTRAVENOUS | Status: DC
  Administered 2012-07-22 – 2012-07-26 (×12): 1500 mg via INTRAVENOUS
  Filled 2012-07-22 (×13): qty 1500

## 2012-07-22 MED ORDER — HYDROMORPHONE HCL PF 2 MG/ML IJ SOLN
2.0000 mg | INTRAMUSCULAR | Status: DC | PRN
  Administered 2012-07-22 – 2012-07-24 (×3): 2 mg via INTRAVENOUS
  Filled 2012-07-22 (×3): qty 1

## 2012-07-22 MED ORDER — HYDROXYUREA 500 MG PO CAPS
500.0000 mg | ORAL_CAPSULE | Freq: Every day | ORAL | Status: DC
  Administered 2012-07-22 – 2012-07-26 (×5): 500 mg via ORAL
  Filled 2012-07-22 (×6): qty 1

## 2012-07-22 MED ORDER — CYCLOBENZAPRINE HCL 5 MG PO TABS
5.0000 mg | ORAL_TABLET | Freq: Three times a day (TID) | ORAL | Status: AC
  Administered 2012-07-22 – 2012-07-23 (×3): 5 mg via ORAL
  Filled 2012-07-22 (×3): qty 1

## 2012-07-22 MED ORDER — LIDOCAINE HCL 1 % IJ SOLN
INTRAMUSCULAR | Status: AC
  Filled 2012-07-22: qty 20

## 2012-07-22 MED ORDER — OXYCODONE HCL 5 MG PO TABS
10.0000 mg | ORAL_TABLET | ORAL | Status: DC
  Administered 2012-07-22 – 2012-07-27 (×27): 10 mg via ORAL
  Filled 2012-07-22 (×26): qty 2

## 2012-07-22 MED ORDER — IOHEXOL 350 MG/ML SOLN
100.0000 mL | Freq: Once | INTRAVENOUS | Status: AC | PRN
  Administered 2012-07-22: 100 mL via INTRAVENOUS

## 2012-07-22 MED ORDER — ENOXAPARIN SODIUM 60 MG/0.6ML ~~LOC~~ SOLN
60.0000 mg | Freq: Two times a day (BID) | SUBCUTANEOUS | Status: DC
  Administered 2012-07-23 (×3): 60 mg via SUBCUTANEOUS
  Filled 2012-07-22 (×5): qty 0.6

## 2012-07-22 MED ORDER — CYCLOBENZAPRINE HCL 5 MG PO TABS
5.0000 mg | ORAL_TABLET | Freq: Three times a day (TID) | ORAL | Status: AC | PRN
  Filled 2012-07-22: qty 1

## 2012-07-22 MED ORDER — ENOXAPARIN SODIUM 60 MG/0.6ML ~~LOC~~ SOLN
60.0000 mg | SUBCUTANEOUS | Status: AC
  Administered 2012-07-22: 60 mg via SUBCUTANEOUS
  Filled 2012-07-22: qty 0.6

## 2012-07-22 MED ORDER — COLLAGENASE 250 UNIT/GM EX OINT
TOPICAL_OINTMENT | Freq: Every day | CUTANEOUS | Status: DC
  Administered 2012-07-22: 23:00:00 via TOPICAL
  Filled 2012-07-22: qty 30

## 2012-07-22 NOTE — Progress Notes (Signed)
[id number]/Rhonda Davis, RN, BSN, CCM:  CHART REVIEWED AND UPDATED.  Next chart review due on 06222014. NO DISCHARGE NEEDS PRESENT AT THIS TIME. CASE MANAGEMENT [phone] 

## 2012-07-22 NOTE — Progress Notes (Signed)
ANTICOAGULATION CONSULT NOTE - Initial Consult  Pharmacy Consult for Lovenox Indication: new LE DVT  No Known Allergies  Patient Measurements: Height: 5\' 5"  (165.1 cm) Weight: 130 lb (58.968 kg) IBW/kg (Calculated) : 61.5  Vital Signs: Temp: 98.6 F (37 C) (06/19 0507) Temp src: Oral (06/19 0507) BP: 116/60 mmHg (06/19 0507) Pulse Rate: 94 (06/19 0507)  Labs:  Recent Labs  07/20/12 0500 07/22/12 0455  HGB 6.2* 7.2*  HCT 17.4* 20.5*  PLT 105* 194  CREATININE 0.50 0.46*    Estimated Creatinine Clearance: 121.9 ml/min (by C-G formula based on Cr of 0.46).   Medical History: Past Medical History  Diagnosis Date  . Sickle cell anemia   . Hypertension   . Asthma      Assessment: 22 yo M with SCD admitted 6/13 with [place] pain crisis.  Patient has been on antibiotics since admission but continues to be febrile. Bilateral upper extremity venous duplex today positive for DVT in the R brachial vein surrounding PICC line and superficial vein thrombosis in L basilic vein. MD ordered to start Lovenox full dose for treatment  Pt weighs 59 kg, Scr wnl for Crcl > 100 ml/min, Hgb 7.2 s/p multiple transfusions, plts improved to wnl today. No bleeding documented.  Goal of Therapy:  Anti-Xa level 0.6-1.2 units/ml 4hrs after LMWH dose given Monitor platelets by anticoagulation protocol: Yes   Plan:   Lovenox 60 mg sq q12h   Consider changing to 90 mg sq daily at discharge for patient's convenience   Pharmacy will f/u  Geoffry Paradise, PharmD, BCPS Pager: [phone] 12:33 PM Pharmacy #: [phone]

## 2012-07-22 NOTE — Progress Notes (Signed)
*  Preliminary Results* Bilateral upper extremity venous duplex completed. The right upper extremity is positive for deep vein thrombosis involving the right brachial vein surrounding the PICC line. Unable to evaluate the right brachial veins and right basilic vein in their entirety due to PICC line location/bandaging.  The left upper extremity is positive for superficial vein thrombosis involving the left basilic vein.  Preliminary results discussed with Dr.Van Dam.  07/22/2012 12:01 PM Gertie Fey, RVT, RDCS, RDMS

## 2012-07-22 NOTE — Progress Notes (Signed)
SICKLE CELL SERVICE PROGRESS NOTE  Cameron [name] DOB: 08-Jan-1991 DOA: 07/16/2012 PCP: No PCP Per Patient  Assessment/Plan: Principal Problem:  DVT: Pt was found to have a DVT in the Right Brachial Vein. I have ordered Lovenox for treatment dosing. I will also have the PICC line removed an alternate IV access placed the Will also obtain CT angio of chest to R/O PE in light of tachycardia.     Tachycardia: The patient has a significant tachycardia which is somewhat concerning. He has no prior history of tachycardia. His rhythm on telemetry it is a sinus tachycardia without any evidence of atrial fibrillation or other dysrhythmias. At the tachycardia does not resolve within the next 24 hours, I will obtain a 2-D echocardiogram for this patient.    Fever, unspecified: Patient needs to have persistent fever. He has multiple sources of fever including 1. Multifocal pneumonia. 2. DVT 3. The vaso-occlusive crisis. So far the cultures are all negative. I've spoken with infectious diseases who recommends that the patient continue on vancomycin imipenem and fluconazole. The patient has a fever the source of which is unclear. Although he has some Candida in the sputum is likely more of a colonization rather than a pathogen althoughthe patient does have some evidence of oral candidiasis. Thus I started the patient on fluconazole. The patient could possibly be having fevers associated with the sickle cell crisis although he has very little pain to indicate an intense vaso-occlusive episode. The patient had an x-ray 1 view which showed evidence of community-acquired pneumonia. We'll repeat a two-view x-ray to get a better evaluation of the parenchyma. The patient has hardware in the shoulder and this could certainly also some inspection although the patient has no significant pain to indicate such. I have asked infectious diseases to see the patient consultation. I have discussed this with Dr. Synthia [name]  and he is asked that we hold off on changing the antibiotics until he's had a chance to see the patient.    CAP (community acquired pneumonia): See above    Leukocytosis: Likely related to an infectious process versus acute vaso-occlusive episode. 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 He has very few crises throughout the last hospitalization being more than one year ago. He is on hydroxyurea but not on folic acid. I will try to reach Cameron [name] regarding the patient. In the meantime I will start the patient on folic acid 1 mg daily by mouth. The patient's pain has been minimal and thus I will transition to his oral medication regimen and give him IV Dilaudid for rescue doses. Patient is also complaining of cramping in the low back and I will add Flexeril for muscle spasms.  Wound on the right ankle: The patient was under the care of the wound Center and they're prescribing collagenase with a dry dressing. I will continue the collagenase with a dry dressing and then as wound ostomy care nurse to see the patient and give further recommendations.     Code Status: Full Code Family Communication: N/A Disposition Plan: Not yet determined  MATTHEWS,MICHELLE A.  Pager [phone]. If 7PM-7AM, please contact night-coverage.  07/22/2012, 7:27 PM  LOS: 6 days   Brief narrative: Cameron [name] is a 22 y.o. male with history of sickle cell with prior admission for sickle cell pain crisis manifested reportedly through back and chest discomfort. Patient states that the  pain first occurred last night. He tried his oxycodone without much relief. Pain has been persistent and gradually getting worse.  While in the ED given his complaint of chest discomfort patient had CT of chest which was negative for PE but did make mention patchy distribution in lungs suspicious for infiltrates.  We have been consulted for admission evaluation  and recommendations for management of sickle cell pain crisis associated with possible pna.    Consultants:  ID  Procedures:  None   Antibiotics:  Cetiaxone 6/13>>6/13  Azithromycin 6/13 >>6/17  Imepenam 6/15 >>  Vancomycin 6/15 >>  Diflucan 6/17 >>  HPI/Subjective: Patient states that this pain is essentially 0/10 at this point after use only Vicodin. He still continues to have a sputum productive of greenish yellowish sputum.   Objective: Filed Vitals:   07/21/12 2128 07/22/12 0507 07/22/12 1400 07/22/12 1905  BP: 127/63 116/60 148/81 135/60  Pulse: 98 94 124 130  Temp: 98.5 F (36.9 C) 98.6 F (37 C) 100.4 F (38 C) 103.1 F (39.5 C)  TempSrc: Oral Oral Oral Oral  Resp: 20 18 21 20   Height:      Weight:      SpO2: 96% 96% 97% 93%   Weight change:   Intake/Output Summary (Last 24 hours) at 07/22/12 1927 Last data filed at 07/22/12 1905  Gross per 24 hour  Intake   3699 ml  Output   2400 ml  Net   1299 ml    General: Alert, awake, oriented x3, in no acute distress.  HEENT: [place]/AT PEERL, EOMI, mild icterus OROPHARYNX:  Moist, No exudate/ erythema/lesions, oral Candida present Heart: Regular rate and rhythm, without murmurs, rubs, gallops, tachycardia on auscultation.  Lungs: Clear to auscultation, no wheezing or rhonchi noted.  Abdomen: Soft, nontender, nondistended, positive bowel sounds, no masses no hepatosplenomegaly noted..  Neuro: No focal neurological deficits noted cranial nerves II through XII grossly intact.. Strength functional in bilateral upper and lower extremities. Musculoskeletal: No warm swelling or erythema around joints, no spinal tenderness noted. No tenderness in the shoulder or hip areas. Patient has an almost healed wound on the right ankle which has no drainage. Psychiatric: Patient alert and oriented x3, good insight and cognition, good recent to remote recall.    Data Reviewed: Basic Metabolic Panel:  Recent Labs Lab  07/16/12 0245 07/17/12 0510 07/18/12 0500 07/19/12 0432 07/20/12 0500 07/22/12 0455  NA 136 131* 134* 132* 134* 136  K 5.0 4.0 3.9 4.2 3.6 4.0  CL 100 97 100 96 97 102  CO2 26 26 27 31 30 29   GLUCOSE 119* 109* 108* 116* 106* 89  BUN 9 12 10 7 9 9   CREATININE 0.55 0.67 0.60 0.59 0.50 0.46*  CALCIUM 8.9 8.9 8.9 8.6 8.5 8.3*  MG 1.9  --   --   --   --   --    Liver Function Tests:  Recent Labs Lab 07/17/12 0510 07/18/12 0500 07/19/12 0432 07/20/12 0500 07/22/12 0455  AST 88* 59* 47* 39* 39*  ALT 33 27 26 26  35  ALKPHOS 106 98 80 71 91  BILITOT 5.2* 4.7* 6.0* 5.5* 3.5*  PROT 7.4 6.6 6.6 6.5 6.4  ALBUMIN 3.8 3.2* 3.1* 3.0* 2.8*   No results found for this basename: LIPASE, AMYLASE,  in the last 168 hours No results found for this basename: AMMONIA,  in the last 168 hours CBC:  Recent Labs Lab 07/16/12 0245 07/18/12 0500 07/18/12 0901 07/18/12 1831 07/19/12 0432 07/20/12  0500 07/22/12 0455  WBC 13.4* 18.5* 19.1*  --  18.9* 16.2* 13.4*  NEUTROABS 8.0*  --   --   --   --  11.4* 7.6  HGB 8.2* 6.2* 6.6* 7.3* 6.9* 6.2* 7.2*  HCT 23.0* 17.2* 17.6* 20.2* 18.7* 17.4* 20.5*  MCV 97.9 96.1 95.7  --  92.1 93.5 90.3  PLT 219 93* 97*  --  100* 105* 194   Cardiac Enzymes: No results found for this basename: CKTOTAL, CKMB, CKMBINDEX, TROPONINI,  in the last 168 hours BNP (last 3 results)  Recent Labs  07/18/12 0901  PROBNP 327.0*   CBG: No results found for this basename: GLUCAP,  in the last 168 hours  Recent Results (from the past 240 hour(s))  CULTURE, BLOOD (ROUTINE X 2)     Status: None   Collection Time    07/18/12  9:01 AM      Result Value Range Status   Specimen Description BLOOD LEFT ARM   Final   Special Requests     Final   Value: BOTTLES DRAWN AEROBIC AND ANAEROBIC 7.5 CC BOTH BOTTLES   Culture  Setup Time 07/18/2012 17:07   Final   Culture     Final   Value:        BLOOD CULTURE RECEIVED NO GROWTH TO DATE CULTURE WILL BE HELD FOR 5 DAYS BEFORE  ISSUING A FINAL NEGATIVE REPORT   Report Status PENDING   Incomplete  CULTURE, BLOOD (ROUTINE X 2)     Status: None   Collection Time    07/18/12  9:08 AM      Result Value Range Status   Specimen Description BLOOD LEFT HAND   Final   Special Requests     Final   Value: BOTTLES DRAWN AEROBIC AND ANAEROBIC 4.5CC BOTH BOTTLES   Culture  Setup Time 07/18/2012 17:07   Final   Culture     Final   Value:        BLOOD CULTURE RECEIVED NO GROWTH TO DATE CULTURE WILL BE HELD FOR 5 DAYS BEFORE ISSUING A FINAL NEGATIVE REPORT   Report Status PENDING   Incomplete  CULTURE, EXPECTORATED SPUTUM-ASSESSMENT     Status: None   Collection Time    07/18/12  1:31 PM      Result Value Range Status   Specimen Description SPUTUM   Final   Special Requests NONE   Final   Sputum evaluation     Final   Value: THIS SPECIMEN IS ACCEPTABLE. RESPIRATORY CULTURE REPORT TO FOLLOW.   Report Status 07/18/2012 FINAL   Final  CULTURE, RESPIRATORY (NON-EXPECTORATED)     Status: None   Collection Time    07/18/12  1:31 PM      Result Value Range Status   Specimen Description SPU   Final   Special Requests NONE   Final   Gram Stain     Final   Value: FEW WBC PRESENT, PREDOMINANTLY PMN     RARE SQUAMOUS EPITHELIAL CELLS PRESENT     NO ORGANISMS SEEN   Culture FEW CANDIDA ALBICANS   Final   Report Status 07/20/2012 FINAL   Final     Studies: Dg Chest 1 View  07/18/2012   *RADIOLOGY REPORT*  Clinical Data: Chest pain. Shortness of breath.  Productive cough. Sickle cell disease.  Asthma.  Elevated white count.  CHEST - 1 VIEW  Comparison: 07/16/2012 chest x-ray and chest CT.  Findings: Progressive asymmetric air space disease most notable mid to  lower lung zone greater left lower lobe raising possibility of infectious infiltrate given the clinical information provided. Atelectasis contributing to the right lung parenchymal changes may also be present.  Cardiomegaly.  Pulmonary vascular congestion/edema.  Gas distended  bowel.  Scoliosis thoracic spine.  IMPRESSION:  Progressive asymmetric air space disease most notable mid to lower lung zone greater left lower lobe raising possibility of infectious infiltrate given the clinical information provided.  Atelectasis contributing to the right lung parenchymal changes may also be present.  Cardiomegaly.  Pulmonary vascular congestion/edema.  Gas distended bowel.  This is a call report.  [facility] radiology technologist)   Original Report Authenticated By: Lacy Duverney, M.D.   Dg Chest 2 View  07/16/2012   *RADIOLOGY REPORT*  Clinical Data: Severe upper back pain.  Sickle cell crisis.  CHEST - 2 VIEW  Comparison: 02/20/2011  Findings: Shallow inspiration.  The heart size and pulmonary vascularity are normal.  No focal airspace consolidation or edema. No blunting of costophrenic angles.  Soft tissue prominence along the right heart border is probably related to vascular crowding due to shallow inspiration.  No corresponding changes are demonstrated on the lateral view.  No pneumothorax.  Surgical clips in the right upper quadrant.  IMPRESSION: Shallow inspiration.  No evidence of active pulmonary disease.   Original Report Authenticated By: Burman Nieves, M.D.   Dg Hip Complete Left  07/19/2012   *RADIOLOGY REPORT*  Clinical Data: Sickle cell anemia, left hip and groin pain, difficulty with flexion and abduction  LEFT HIP - COMPLETE 2+ VIEW  Comparison: None.  Findings: Symmetric hip joints. Osseous mineralization grossly normal for technique. No acute fracture, dislocation or bone destruction. Small pelvic phleboliths noted.  IMPRESSION: No definite acute osseous abnormalities.   Original Report Authenticated By: Ulyses Southward, M.D.   Ct Angio Chest Pe W/cm &/or Wo Cm  07/16/2012   *RADIOLOGY REPORT*  Clinical Data: Chest pain and tachycardia.  Sickle cell pain. History of hypertension and asthma.  White cell count 13.4. Nonsmoker.  CT ANGIOGRAPHY CHEST  Technique:  Multidetector  CT imaging of the chest using the standard protocol during bolus administration of intravenous contrast. Multiplanar reconstructed images including MIPs were obtained and reviewed to evaluate the vascular anatomy.  Contrast: OMNIPAQUE IOHEXOL 350 MG/ML SOLN  Comparison: None.  Findings: Technically adequate study with good opacification of the central and segmental pulmonary arteries.  No focal filling defects are demonstrated.  No evidence of significant pulmonary embolus.  Normal caliber thoracic aorta without evidence of dissection. Cardiac enlargement.  The esophagus is fluid-filled and distended which may suggest reflux or dysmotility.  No significant lymphadenopathy in the chest.  No pleural effusions. Diffusely scattered patchy atelectasis or infiltration in the lungs. No pneumothorax.  Airways appear patent.  Thoracic vertebrae appear intact without displacement or compression.  Visualized portions of the upper abdominal organs demonstrate atrophic spleen with increased density.  Small cyst in the spleen. Changes are consistent with sickle cell change.  IMPRESSION: No evidence of significant pulmonary embolus.  Fluid filled mildly distended esophagus may represent reflux or dysmotility.  Patchy distribution of areas of atelectasis or infiltration in the lungs.   Original Report Authenticated By: Burman Nieves, M.D.   Mr Hip Left Wo Contrast  07/20/2012   *RADIOLOGY REPORT*  Clinical Data: Hip pain.  Sickle cell anemia.  MRI OF THE LEFT HIP WITHOUT CONTRAST  Technique:  Multiplanar, multisequence MR imaging was performed. No intravenous contrast was administered.  Comparison: Radiographs dated 07/19/2012  Findings:  There are infarcts of both sacral alae.  There is also a bone infarct of the posterior tip of the right greater trochanter as well as at the anterior medial aspects of both acetabuli.  There is diffuse reactivation of the red marrow in all of the visualized bones.  There is a small right  hip effusion.  There is a physiologic amount of fluid in the left hip.  No evidence of avascular necrosis.  Slight nonspecific subcutaneous edema in the lateral aspect of each buttock superficial to the gluteal muscles.  Slight presacral edema is most likely a secondary reaction due to the sacral infarcts.  IMPRESSION:  1.  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 3.  Small infarct of the tip of the right greater trochanter. 4.  No evidence of avascular necrosis of the femoral heads. 5.  Small right hip effusion.   Original Report Authenticated By: Francene Boyers, M.D.    Scheduled Meds: . [START ON 07/23/2012] enoxaparin (LOVENOX) injection  60 mg Subcutaneous Q12H  . fluconazole  100 mg Oral Daily  . folic acid  1 mg Oral Daily  . hydroxyurea  500 mg Oral Daily  . imipenem-cilastatin  500 mg Intravenous Q8H  . lidocaine      . lisinopril  10 mg Oral Daily  . oxyCODONE  10 mg Oral Q4H  . polyethylene glycol  17 g Oral Daily  . vancomycin  1,500 mg Intravenous Q8H   Continuous Infusions: . sodium chloride 20 mL/hr at 07/22/12 1343    Total time 37 minutes

## 2012-07-22 NOTE — Progress Notes (Signed)
Pt with temp of 103.1 going to give tylenol and order cultures. Called midlevel awaiting call back. Pt instructed to use IS.

## 2012-07-22 NOTE — Progress Notes (Signed)
[facility]  Day # 8 antibiotics  Day # 5 vancomycin  Day # 5 imipenem  Day #  3 fluconazole  Received 2 days of levaquin, 3 days of azithromycin, 1 day of ceftriaxone  Subjective: Having doppler study   Antibiotics:  Anti-infectives   Start     Dose/Rate Route Frequency Ordered Stop   07/21/12 1400  fluconazole (DIFLUCAN) IVPB 100 mg  Status:  Discontinued     100 mg 50 mL/hr over 60 Minutes Intravenous Every 24 hours 07/20/12 1202 07/20/12 1753   07/20/12 1830  fluconazole (DIFLUCAN) tablet 100 mg     100 mg Oral Daily 07/20/12 1753     07/20/12 1800  vancomycin (VANCOCIN) 1,250 mg in sodium chloride 0.9 % 250 mL IVPB     1,250 mg 166.7 mL/hr over 90 Minutes Intravenous Every 8 hours 07/20/12 1722     07/20/12 1300  fluconazole (DIFLUCAN) IVPB 200 mg     200 mg 100 mL/hr over 60 Minutes Intravenous  Once 07/20/12 1202 07/20/12 1424   07/20/12 0600  vancomycin (VANCOCIN) 1,250 mg in sodium chloride 0.9 % 250 mL IVPB  Status:  Discontinued     1,250 mg 166.7 mL/hr over 90 Minutes Intravenous Every 8 hours 07/19/12 2149 07/20/12 1440   07/19/12 2200  vancomycin (VANCOCIN) 500 mg in sodium chloride 0.9 % 100 mL IVPB     500 mg 100 mL/hr over 60 Minutes Intravenous  Once 07/19/12 2146 07/19/12 2352   07/18/12 1200  imipenem-cilastatin (PRIMAXIN) 500 mg in sodium chloride 0.9 % 100 mL IVPB     500 mg 200 mL/hr over 30 Minutes Intravenous 3 times per day 07/18/12 0928     07/18/12 1200  vancomycin (VANCOCIN) IVPB 750 mg/150 ml premix  Status:  Discontinued     750 mg 150 mL/hr over 60 Minutes Intravenous Every 8 hours 07/18/12 1023 07/19/12 2149   07/18/12 1000  azithromycin (ZITHROMAX) 500 mg in dextrose 5 % 250 mL IVPB  Status:  Discontinued     500 mg 250 mL/hr over 60 Minutes Intravenous Every 24 hours 07/18/12 0928 07/20/12 1418   07/16/12 1400  levofloxacin (LEVAQUIN) IVPB 750 mg  Status:  Discontinued     750 mg 100 mL/hr over 90 Minutes  Intravenous Every 24 hours 07/16/12 1128 07/18/12 0922   07/16/12 0445  cefTRIAXone (ROCEPHIN) 2 g in dextrose 5 % 50 mL IVPB     2 g 100 mL/hr over 30 Minutes Intravenous  Once 07/16/12 0436 07/16/12 0731   07/16/12 0445  azithromycin (ZITHROMAX) 500 mg in dextrose 5 % 250 mL IVPB     500 mg 250 mL/hr over 60 Minutes Intravenous  Once 07/16/12 0436 07/16/12 0625      Medications: Scheduled Meds: . fluconazole  100 mg Oral Daily  . folic acid  1 mg Oral Daily  . imipenem-cilastatin  500 mg Intravenous Q8H  . lisinopril  10 mg Oral Daily  . morphine  15 mg Oral Q12H  . polyethylene glycol  17 g Oral Daily  . vancomycin  1,250 mg Intravenous Q8H   Continuous Infusions: . sodium chloride 100 mL/hr at 07/22/12 1001   PRN Meds:.sodium chloride, acetaminophen, albuterol, HYDROcodone-acetaminophen, HYDROmorphone (DILAUDID) injection, ibuprofen, ipratropium, naloxone, ondansetron (ZOFRAN) IV, ondansetron, senna, sodium chloride, sodium chloride   Objective: Weight change:   Intake/Output Summary (Last 24 hours) at 07/22/12 1221 Last data filed at 07/22/12 0820  Gross per 24 hour  Intake 2132.5  ml  Output   1250 ml  Net  882.5 ml   Blood pressure 116/60, pulse 94, temperature 98.6 F (37 C), temperature source Oral, resp. rate 18, height 5\' 5"  (1.651 m), weight 130 lb (58.968 kg), SpO2 96.00%. Temp:  [98.5 F (36.9 C)-99.6 F (37.6 C)] 98.6 F (37 C) (06/19 0507) Pulse Rate:  [94-126] 94 (06/19 0507) Resp:  [18-20] 18 (06/19 0507) BP: (116-144)/(59-75) 116/60 mmHg (06/19 0507) SpO2:  [96 %] 96 % (06/19 0507)  Physical Exam: General: Alert and awake, oriented x3, not in any acute distress. Having doppler study  Lab Results:  Recent Labs  07/20/12 0500 07/22/12 0455  WBC 16.2* 13.4*  HGB 6.2* 7.2*  HCT 17.4* 20.5*  PLT 105* 194    BMET  Recent Labs  07/20/12 0500 07/22/12 0455  NA 134* 136  K 3.6 4.0  CL 97 102  CO2 30 29  GLUCOSE 106* 89  BUN 9 9    CREATININE 0.50 0.46*  CALCIUM 8.5 8.3*    Micro Results: Recent Results (from the past 240 hour(s))  CULTURE, BLOOD (ROUTINE X 2)     Status: None   Collection Time    07/18/12  9:01 AM      Result Value Range Status   Specimen Description BLOOD LEFT ARM   Final   Special Requests     Final   Value: BOTTLES DRAWN AEROBIC AND ANAEROBIC 7.5 CC BOTH BOTTLES   Culture  Setup Time 07/18/2012 17:07   Final   Culture     Final   Value:        BLOOD CULTURE RECEIVED NO GROWTH TO DATE CULTURE WILL BE HELD FOR 5 DAYS BEFORE ISSUING A FINAL NEGATIVE REPORT   Report Status PENDING   Incomplete  CULTURE, BLOOD (ROUTINE X 2)     Status: None   Collection Time    07/18/12  9:08 AM      Result Value Range Status   Specimen Description BLOOD LEFT HAND   Final   Special Requests     Final   Value: BOTTLES DRAWN AEROBIC AND ANAEROBIC 4.5CC BOTH BOTTLES   Culture  Setup Time 07/18/2012 17:07   Final   Culture     Final   Value:        BLOOD CULTURE RECEIVED NO GROWTH TO DATE CULTURE WILL BE HELD FOR 5 DAYS BEFORE ISSUING A FINAL NEGATIVE REPORT   Report Status PENDING   Incomplete  CULTURE, EXPECTORATED SPUTUM-ASSESSMENT     Status: None   Collection Time    07/18/12  1:31 PM      Result Value Range Status   Specimen Description SPUTUM   Final   Special Requests NONE   Final   Sputum evaluation     Final   Value: THIS SPECIMEN IS ACCEPTABLE. RESPIRATORY CULTURE REPORT TO FOLLOW.   Report Status 07/18/2012 FINAL   Final  CULTURE, RESPIRATORY (NON-EXPECTORATED)     Status: None   Collection Time    07/18/12  1:31 PM      Result Value Range Status   Specimen Description SPU   Final   Special Requests NONE   Final   Gram Stain     Final   Value: FEW WBC PRESENT, PREDOMINANTLY PMN     RARE SQUAMOUS EPITHELIAL CELLS PRESENT     NO ORGANISMS SEEN   Culture FEW CANDIDA ALBICANS   Final   Report Status 07/20/2012 FINAL   Final  Studies/Results: Dg Chest 2 View  07/22/2012   *RADIOLOGY  REPORT*  Clinical Data: Cough, history of sickle cell disease  CHEST - 2 VIEW  Comparison: 07/10/2012; 07/18/2012; 07/16/2012; chest CT - 07/16/2012  Findings:  Grossly unchanged enlarged cardiac silhouette.  Interval placement of right upper extremity approach PICC line with tip projected over the superior cavoatrial junction.  Grossly unchanged rather extensive bilateral mid and lower lung heterogeneous air space opacities, left greater than right.  Trace bilateral effusions are suspected, left greater than right.  No pneumothorax.  No definite evidence of edema.  Unchanged bones.  Post cholecystectomy.  IMPRESSION: 1.  Right upper extremity approach PICC line tip projects of the superior cava junction. 2.  Grossly unchanged rather extensive bilateral mid and lower lung airspace opacities, left greater than right, worrisome for multifocal infection.   Original Report Authenticated By: Tacey Ruiz, MD   Dg Chest 2 View  07/20/2012   *RADIOLOGY REPORT*  Clinical Data: Cough and fever.  CHEST - 2 VIEW  Comparison: 07/18/2012  Findings: Two views of the chest again demonstrate patchy airspace densities in the left mid and lower lung regions.  Persistent densities in the right mid and right perihilar region.  Heart size is stable.  The distribution of lung disease has not significantly changed.  Heart size is stable.  IMPRESSION: Minimal change in the bilateral airspace disease.  Findings are concerning for pneumonia.   Original Report Authenticated By: Richarda Overlie, M.D.      Assessment/Plan: Cameron [name] is a 22 y.o. male  admitted with sickle cell crisis, who was treated also for CAP on admission though his CXR and CT were underwhelming with fever onset while in the hospital and progressive infiltrates RML Left mid LLung broadened to vanco, imipenem, and now fluconazole also with hip effusion   #1 FUO: thought Likely either due to HCAP vs sickle cell crisis itself vs other more occult infection such as  septic hip or shoulder infection. , but NOW FOUND To have DVT by doppler on the right  --NEED anticoagulation, spoke with Marthann Schiller. May also want to remove PICC though it is my understanding it is not right against thrombus --continue vancomycin and imipenem for now  --fu blood  Cultures --may need to aspirate hip if still fevering but I would like to be patient given we still have other explanations for fever and his DVT is newly dx and MRI is not a "slam dunk for infection.  #2 HCAP: Fever curve improved post vanco.imipenem and he is on day #5 of these abx.  --continue imi/vanco  For now, given multi-lobar nature of infiltrates may want to consider complete a course for MRSA PNA with zyvox vs doxy once he is stablized from inpatient world   #3 Thrush: --continue fluconazole but use po    #4 Hip effusion: not obviously infected though If he continues to  Fever  thru current abx or has more pain there may need to consider IR guided aspiration for cell count and diff, crystals and culture   #5 DVT: will need anticoagulation and this could be a reason for fevers despite abx  #5 shoulder pain: clarified, no hardware in this site  #6 Screening: will check HIV     LOS: 6 days   Acey Lav 07/22/2012, 12:21 PM

## 2012-07-22 NOTE — Progress Notes (Signed)
ANTIBIOTIC CONSULT NOTE - FOLLOW UP  Pharmacy Consult for Vancomycin Indication: multifocal PNA  No Known Allergies  Patient Measurements: Height: 5\' 5"  (165.1 cm) Weight: 130 lb (58.968 kg) IBW/kg (Calculated) : 61.5  Vital Signs: Temp: 98.6 F (37 C) (06/19 0507) Temp src: Oral (06/19 0507) BP: 116/60 mmHg (06/19 0507) Pulse Rate: 94 (06/19 0507) Intake/Output from previous day: 06/18 0701 - 06/19 0700 In: 2332.5 [P.O.:720; I.V.:900; Blood:262.5; IV Piggyback:450] Out: 1700 [Urine:1700] Intake/Output from this shift: Total I/O In: 240 [P.O.:240] Out: 375 [Urine:375]  Labs:  Recent Labs  07/20/12 0500 07/22/12 0455  WBC 16.2* 13.4*  HGB 6.2* 7.2*  PLT 105* 194  CREATININE 0.50 0.46*   Estimated Creatinine Clearance: 121.9 ml/min (by C-G formula based on Cr of 0.46).  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 CXR on admit with no evidence of infection but pt found tachycardic, hypoxic, elevated WBC - CT with patchy distribution of atelectasis vs infiltration in the lungs. Levaquin started for r/o CAP without improvement. On 6/15, WBC continued to rise, pt febrile, anemia and thrombocytopenia worsened, CXR with progression of infection - worried for sepsis, abx broadened as follow .Marland Kitchen    6/13 CTX, azithro x 1  6/13 >> Levaquin >> 6/15 6/15 >> Vancomycin >>  6/15 >> Primaxin >>  6/15 >> Azithro >> 6/17 6/17 >> Fluconazole (thrush) >>   Today is D#5 Vancomycin, D#3 Fluconazole, D#7 total antibiotics.  Tmax 102.5, WBC down to 13.4K, CrCl > 100 ml/min.    Candida albicans in sputum is not a pathogen per ID. CXR 6/19 extensive airspace opacities, worrisome for multifocal infection. New DVT found today - possible cause of fever. Per ID, given multi-lobar nature of infiltrates may want to consider complete a course for MRSA PNA with Zovyx vs Doxy once he is stable. If fever not  improve with anticoagulation for DVT, may need bronchoscopy. Hip effusion on MRI and shoulder pain - not obviously infected but if continues to have fever/pain may need IR guided aspiration/culture of effusion and imaging of shoulder.   Vancomycin trough today 12.3 on Vancomycin 1250 mg IV q8h.    Goal of Therapy:  Vancomycin trough level 15-20 mcg/ml  Plan:   Increase vancomycin to 1500 mg IV q8h    Continue Primaxin 500 mg IV q8h   Pharmacy will f/u  Geoffry Paradise, PharmD, BCPS Pager: [phone] 1:57 PM Pharmacy #: [id number]

## 2012-07-22 NOTE — Procedures (Signed)
Procedure:  Right IJ tunneled power line placement Findings:  6 Fr, DL power line via right IJ vein with tip at cavoatrial junction.  OK to use.

## 2012-07-23 DIAGNOSIS — L97309 Non-pressure chronic ulcer of unspecified ankle with unspecified severity: Secondary | ICD-10-CM

## 2012-07-23 LAB — COMPREHENSIVE METABOLIC PANEL
ALT: 29 U/L (ref 0–53)
AST: 31 U/L (ref 0–37)
CO2: 29 mEq/L (ref 19–32)
Calcium: 8.6 mg/dL (ref 8.4–10.5)
Sodium: 134 mEq/L — ABNORMAL LOW (ref 135–145)
Total Protein: 6.6 g/dL (ref 6.0–8.3)

## 2012-07-23 LAB — URINE CULTURE

## 2012-07-23 NOTE — Consult Note (Addendum)
WOC consult Note Reason for Consult: Consult requested for left ankle wounds.  Pt is followed by the outpatient wound care center and collagen dressing is applied 2X week.  This topical treatment is not available in the [facility] formulary.  Substitute Aquacel for dressing. Wound type: healing full thickness wounds to left outer ankle Measurement: .2X.2X.1cm and .1X.1X.1cm Wound bed: both sites 100% pink and moist Drainage (amount, consistency, odor) no odor, small amt tan drainage Periwound: intact skin surrounding Dressing procedure/placement/frequency: Aquacel to absorb drainage and promote healing.  Pt can resume follow-up with outpatient wound care center after discharge for Collagen dressings. Bedside nurse can change current dressing if pt still in the hospital on Tuesday. Pt very well-informed regarding topical care routines. Please re-consult if further assistance is needed.  Thank-you,  Cammie Mcgee MSN, RN, CWOCN, [place], CNS [phone]

## 2012-07-23 NOTE — Progress Notes (Signed)
Subjective: Patient is still having fever with a temperature of 103.1 today. This is worse than yesterday.  His hip pain is also better he was able to walk slightly. He is not short of breath. Complaining of pain from numerous blood draws and IV lines. He denies any chest pain, no nausea vomiting or diarrhea. Patient has not had any chest pain today. Has continued to have tachycardia.  Objective: Vital signs in last 24 hours: Temp:  [99.6 F (37.6 C)-103.1 F (39.5 C)] 99.6 F (37.6 C) (06/20 0530) Pulse Rate:  [102-130] 102 (06/20 0530) Resp:  [16-21] 16 (06/20 0530) BP: (124-148)/(60-81) 124/68 mmHg (06/20 1006) SpO2:  [93 %-97 %] 94 % (06/20 0530) Weight change:  Last BM Date: 07/21/12  Intake/Output from previous day: 06/19 0701 - 06/20 0700 In: 3278.3 [P.O.:1080; I.V.:198.3; IV Piggyback:2000] Out: 3400 [Urine:3400] Intake/Output this shift:    General appearance: alert, cooperative and mild distress Eyes: conjunctivae/corneas clear. PERRL, EOM's intact. Fundi benign. Throat: abnormal findings: thrush Neck: no adenopathy, no carotid bruit, no JVD, supple, symmetrical, trachea midline and thyroid not enlarged, symmetric, no tenderness/mass/nodules Resp: rales bilaterally and rhonchi bibasilar Chest wall: no tenderness Cardio: regular rate and rhythm, S1, S2 normal, no murmur, click, rub or gallop GI: soft, non-tender; bowel sounds normal; no masses,  no organomegaly Extremities: extremities normal, atraumatic, no cyanosis or edema Pulses: 2+ and symmetric Skin: Skin color, texture, turgor normal. No rashes or lesions Neurologic: Grossly normal  Lab Results:  Recent Labs  07/22/12 0455  WBC 13.4*  HGB 7.2*  HCT 20.5*  PLT 194   BMET  Recent Labs  07/22/12 0455 07/23/12 0315  NA 136 134*  K 4.0 3.9  CL 102 99  CO2 29 29  GLUCOSE 89 93  BUN 9 6  CREATININE 0.46* 0.52  CALCIUM 8.3* 8.6    Studies/Results: Dg Chest 2 View  07/22/2012   *RADIOLOGY REPORT*   Clinical Data: Cough, history of sickle cell disease  CHEST - 2 VIEW  Comparison: 07/10/2012; 07/18/2012; 07/16/2012; chest CT - 07/16/2012  Findings:  Grossly unchanged enlarged cardiac silhouette.  Interval placement of right upper extremity approach PICC line with tip projected over the superior cavoatrial junction.  Grossly unchanged rather extensive bilateral mid and lower lung heterogeneous air space opacities, left greater than right.  Trace bilateral effusions are suspected, left greater than right.  No pneumothorax.  No definite evidence of edema.  Unchanged bones.  Post cholecystectomy.  IMPRESSION: 1.  Right upper extremity approach PICC line tip projects of the superior cava junction. 2.  Grossly unchanged rather extensive bilateral mid and lower lung airspace opacities, left greater than right, worrisome for multifocal infection.   Original Report Authenticated By: Tacey Ruiz, MD   Ct Angio Chest Pe W/cm &/or Wo Cm  07/22/2012   *RADIOLOGY REPORT*  Clinical Data: CT in the setting of tachycardia and chest pain, evaluate for pulmonary embolus  CT ANGIOGRAPHY CHEST  Technique:  Multidetector CT imaging of the chest using the standard protocol during bolus administration of intravenous contrast. Multiplanar reconstructed images including MIPs were obtained and reviewed to evaluate the vascular anatomy.  Contrast: OMNIPAQUE IOHEXOL 350 MG/ML SOLN  Comparison: Chest x-ray obtained earlier today at 08:21 a.m.; prior CT scan of the chest 07/16/2012  Findings:  Mediastinum: Unremarkable CT appearance of the thyroid gland. Similar appearance of upper mediastinal adenopathy.  An index high prevascular lymph node measures 9 mm in short axis which is stable compared to the recent  prior examination. A high right paratracheal lymph node measures 11 mm in short axis on image 10 of series 4. Unremarkable appearance of the thoracic esophagus.  Heart/Vascular: Relatively poor evaluation of the pulmonary  arterial tree secondary to a combination of extensive respiratory motion in the lower lungs and marginal opacification of the pulmonary arteries secondary to contrast bolus timing.  No large central, lobe are or proximal segmental pulmonary emboli.  More distal segmental and subsegmental evaluation is significantly limited. There is a bovine configuration of the aortic arch (two vessel arch with common origin of the brachiocephalic and left common carotid arteries), a normal anatomic variant.  Similar degree of cardiomegaly with left ventricular prominence.  There may be a trace dependent pericardial effusion.  Lungs/Pleura: Progressive diffuse ground-glass attenuation airspace disease in the bilateral lower lobes, inferior right upper lobe and the inferior left upper lobe with areas of patchy ground-glass attenuation opacity in a bronchovascular distribution predominately in the bilateral lower lobes.  New small left pleural effusion.  Upper Abdomen: Calcified partially auto infarcted spleen. Otherwise, the upper abdomen is unremarkable.  Bones: Levoconvex scoliosis of the upper thoracic spine. No acute fracture or aggressive appearing lytic or blastic osseous lesion.  IMPRESSION:  1.  Negative for central, lobar or proximal segmental pulmonary emboli.  Evaluation of the more distal pulmonary arterial tree is limited by significant respiratory motion and contrast bolus timing.  2. Marked interval progression of a combination of ground-glass attenuation airspace opacity and patchy areas of consolidation in the dependent aspects of the bilateral upper and lower lobes. Airspace disease is most marked in the lower lobes and demonstrates a peribronchovascular distribution.  Primary considerations include aspiration pneumonia/pneumonitis, multi focal pneumonia, and potentially veno occlusive disease given the patient's history of sickle cell anemia.  3. Stable cardiomegaly likely related to elevated cardiac output in the  setting of chronic anemia.  4.  New small left pleural effusion.  5.  Stable upper mediastinal adenopathy, likely reactive.  6. Trace dependent pericardial effusion.   Original Report Authenticated By: Malachy Moan, M.D.   Ir Fluoro Guide Cv Line Right  07/22/2012   *RADIOLOGY REPORT*  Clinical Data: Sickle cell anemia and need for IV antibiotic therapy.  The patient has bilateral upper extremity DVT related to prior PICC line placement and requires a jugular tunneled catheter.  TUNNELED CENTRAL VENOUS CATHETER PLACEMENT WITH ULTRASOUND AND FLUOROSCOPIC GUIDANCE  Fluoroscopy Time:  12 seconds.  Procedure:  The procedure, risks, benefits, and alternatives were explained to the patient.  Questions regarding the procedure were encouraged and answered.  The patient understands and consents to the procedure.  The right neck and chest were prepped with chlorhexidine in a sterile fashion, and a sterile drape was applied covering the operative field.  Maximum barrier sterile technique with sterile gowns and gloves were used for the procedure.  Local anesthesia was provided with 1% lidocaine.  Ultrasound was used to confirm patency of the right internal jugular vein.  After creating a small venotomy incision, a 21 gauge needle was advanced into the the right internal jugular vein under direct, real-time ultrasound guidance.  Ultrasound image documentation was performed.  A 6-French tunneled dual lumen power line measuring 21 cm from tip to cuff was chosen for placement.  This was tunneled in a retrograde fashion from the chest wall to the venotomy incision.  At the venotomy, a 6 Fr peel-away sheath was placed over a guidewire.  The catheter was then placed through the sheath and the  sheath removed.  Final catheter positioning was confirmed and documented with a fluoroscopic spot image.  The catheter was aspirated and flushed with saline.  The venotomy incision was closed with subcutaneous 4-0 Vicryl. Dermabond was  applied to the incision.  The catheter exit site was secured with 0-Prolene retention sutures.  Complications: None.  No pneumothorax.  Findings:  After catheter placement, the tip lies at the cavoatrial junction.  The catheter aspirates normally and is ready for immediate use.  IMPRESSION:  Placement of tunneled 6-French dual lumen power line via the right internal jugular vein.  The catheter tip lies at the cavoatrial junction.  The catheter is ready for immediate use.   Original Report Authenticated By: Irish Lack, M.D.   Ir US Guide Vasc Access Right  07/22/2012   *RADIOLOGY REPORT*  Clinical Data: Sickle cell anemia and need for IV antibiotic therapy.  The patient has bilateral upper extremity DVT related to prior PICC line placement and requires a jugular tunneled catheter.  TUNNELED CENTRAL VENOUS CATHETER PLACEMENT WITH ULTRASOUND AND FLUOROSCOPIC GUIDANCE  Fluoroscopy Time:  12 seconds.  Procedure:  The procedure, risks, benefits, and alternatives were explained to the patient.  Questions regarding the procedure were encouraged and answered.  The patient understands and consents to the procedure.  The right neck and chest were prepped with chlorhexidine in a sterile fashion, and a sterile drape was applied covering the operative field.  Maximum barrier sterile technique with sterile gowns and gloves were used for the procedure.  Local anesthesia was provided with 1% lidocaine.  Ultrasound was used to confirm patency of the right internal jugular vein.  After creating a small venotomy incision, a 21 gauge needle was advanced into the the right internal jugular vein under direct, real-time ultrasound guidance.  Ultrasound image documentation was performed.  A 6-French tunneled dual lumen power line measuring 21 cm from tip to cuff was chosen for placement.  This was tunneled in a retrograde fashion from the chest wall to the venotomy incision.  At the venotomy, a 6 Fr peel-away sheath was placed over a  guidewire.  The catheter was then placed through the sheath and the sheath removed.  Final catheter positioning was confirmed and documented with a fluoroscopic spot image.  The catheter was aspirated and flushed with saline.  The venotomy incision was closed with subcutaneous 4-0 Vicryl. Dermabond was applied to the incision.  The catheter exit site was secured with 0-Prolene retention sutures.  Complications: None.  No pneumothorax.  Findings:  After catheter placement, the tip lies at the cavoatrial junction.  The catheter aspirates normally and is ready for immediate use.  IMPRESSION:  Placement of tunneled 6-French dual lumen power line via the right internal jugular vein.  The catheter tip lies at the cavoatrial junction.  The catheter is ready for immediate use.   Original Report Authenticated By: Irish Lack, M.D.    Medications: I have reviewed the patient's current medications.  Assessment/Plan: 22 year old gentleman with community-acquired pneumonia and sickle cell painful crisis now with fever, left hip pain oral thrush.  #1 left hip pain: Much improved. Patient will or any further imaging. If his fever continued to be too high we may have to aspirated these joint to look for evidence of septic arthritis. Infectious disease is also following patient.  #2 sickle cell painful crisis: Continue his current treatment. Not much pain today.  #3 community-acquired pneumonia: Patient has been on antibiotics for 7 days now. He does not require any  oxygen indicating that his lung disease is better. He continues to have fever which may be secondary to other infectious causes, his sickle cell crisis, or the newly diagnosed DVT. Continue antibiotics and will need home IV antibiotics most likely.  #4 leukocytosis: Has been improving. We'll recheck it in the morning.  #5 sickle cell anemia: Patient received transfusion of 1 pack red blood cells 2 days ago. His hemoglobin seems to be above 7 so we'll  keep an eye on it.  #6 hypertension: Continue the lisinopril.  #7 constipation: Seems resolved now. Continue to do laxatives.  #8 right upper extremity DVT: At the site of insertion of PICC line. Patient is currently on Lovenox and will likely be also helpful probably for the rest of his life. He is at high risk for embolic phenomena. He currently has a right IJ catheter but will most likely need another PICC line on the left upper extremity to be inserted by IR prior to leaving.    LOS: 7 days    GARBA,LAWAL 07/23/2012, 11:30 AM

## 2012-07-23 NOTE — Progress Notes (Signed)
[facility]  Day # 9  antibiotics  Day # 6 vancomycin  Day # 6  imipenem  Day #  4 fluconazole  Received 2 days of levaquin, 3 days of azithromycin, 1 day of ceftriaxone  Subjective: No new complaints   Antibiotics:  Anti-infectives   Start     Dose/Rate Route Frequency Ordered Stop   07/22/12 1400  vancomycin (VANCOCIN) 1,500 mg in sodium chloride 0.9 % 500 mL IVPB     1,500 mg 250 mL/hr over 120 Minutes Intravenous Every 8 hours 07/22/12 1358     07/21/12 1400  fluconazole (DIFLUCAN) IVPB 100 mg  Status:  Discontinued     100 mg 50 mL/hr over 60 Minutes Intravenous Every 24 hours 07/20/12 1202 07/20/12 1753   07/20/12 1830  fluconazole (DIFLUCAN) tablet 100 mg     100 mg Oral Daily 07/20/12 1753     07/20/12 1800  vancomycin (VANCOCIN) 1,250 mg in sodium chloride 0.9 % 250 mL IVPB  Status:  Discontinued     1,250 mg 166.7 mL/hr over 90 Minutes Intravenous Every 8 hours 07/20/12 1722 07/22/12 1358   07/20/12 1300  fluconazole (DIFLUCAN) IVPB 200 mg     200 mg 100 mL/hr over 60 Minutes Intravenous  Once 07/20/12 1202 07/20/12 1424   07/20/12 0600  vancomycin (VANCOCIN) 1,250 mg in sodium chloride 0.9 % 250 mL IVPB  Status:  Discontinued     1,250 mg 166.7 mL/hr over 90 Minutes Intravenous Every 8 hours 07/19/12 2149 07/20/12 1440   07/19/12 2200  vancomycin (VANCOCIN) 500 mg in sodium chloride 0.9 % 100 mL IVPB     500 mg 100 mL/hr over 60 Minutes Intravenous  Once 07/19/12 2146 07/19/12 2352   07/18/12 1200  imipenem-cilastatin (PRIMAXIN) 500 mg in sodium chloride 0.9 % 100 mL IVPB     500 mg 200 mL/hr over 30 Minutes Intravenous 3 times per day 07/18/12 0928     07/18/12 1200  vancomycin (VANCOCIN) IVPB 750 mg/150 ml premix  Status:  Discontinued     750 mg 150 mL/hr over 60 Minutes Intravenous Every 8 hours 07/18/12 1023 07/19/12 2149   07/18/12 1000  azithromycin (ZITHROMAX) 500 mg in dextrose 5 % 250 mL IVPB  Status:  Discontinued     500 mg 250 mL/hr over 60 Minutes Intravenous Every 24 hours 07/18/12 0928 07/20/12 1418   07/16/12 1400  levofloxacin (LEVAQUIN) IVPB 750 mg  Status:  Discontinued     750 mg 100 mL/hr over 90 Minutes Intravenous Every 24 hours 07/16/12 1128 07/18/12 0922   07/16/12 0445  cefTRIAXone (ROCEPHIN) 2 g in dextrose 5 % 50 mL IVPB     2 g 100 mL/hr over 30 Minutes Intravenous  Once 07/16/12 0436 07/16/12 0731   07/16/12 0445  azithromycin (ZITHROMAX) 500 mg in dextrose 5 % 250 mL IVPB     500 mg 250 mL/hr over 60 Minutes Intravenous  Once 07/16/12 0436 07/16/12 0625      Medications: Scheduled Meds: . cyclobenzaprine  5 mg Oral TID  . enoxaparin (LOVENOX) injection  60 mg Subcutaneous Q12H  . fluconazole  100 mg Oral Daily  . folic acid  1 mg Oral Daily  . hydroxyurea  500 mg Oral Daily  . imipenem-cilastatin  500 mg Intravenous Q8H  . lisinopril  10 mg Oral Daily  . oxyCODONE  10 mg Oral Q4H  . polyethylene glycol  17 g Oral Daily  . vancomycin  1,500  mg Intravenous Q8H   Continuous Infusions: . sodium chloride 20 mL/hr at 07/22/12 1343   PRN Meds:.sodium chloride, acetaminophen, albuterol, cyclobenzaprine, HYDROcodone-acetaminophen, HYDROmorphone (DILAUDID) injection, ibuprofen, ipratropium, naloxone, ondansetron (ZOFRAN) IV, ondansetron, senna, sodium chloride, sodium chloride   Objective: Weight change:   Intake/Output Summary (Last 24 hours) at 07/23/12 1544 Last data filed at 07/23/12 0950  Gross per 24 hour  Intake 3038.33 ml  Output   3125 ml  Net -86.67 ml   Blood pressure 129/70, pulse 98, temperature 98.9 F (37.2 C), temperature source Oral, resp. rate 18, height 5\' 5"  (1.651 m), weight 130 lb (58.968 kg), SpO2 95.00%. Temp:  [98.9 F (37.2 C)-103.1 F (39.5 C)] 98.9 F (37.2 C) (06/20 1300) Pulse Rate:  [98-130] 98 (06/20 1300) Resp:  [16-20] 18 (06/20 1300) BP: (124-135)/(60-75) 129/70 mmHg (06/20 1300) SpO2:  [93 %-95 %] 95 % (06/20 1300)  Physical  Exam: General: Alert and awake, oriented x3, not in any acute distress.  HEENT: anicteric sclera,  EOMI, oropharynx with coating on tongue  CVS Tachy regular rate, normal r, no murmur rubs or gallops  Chest: rhonchi right side,  Abdomen: soft nontender, nondistended, normal bowel sounds,   Neuro: nonfocal,  Lab Results:  Recent Labs  07/22/12 0455  WBC 13.4*  HGB 7.2*  HCT 20.5*  PLT 194    BMET  Recent Labs  07/22/12 0455 07/23/12 0315  NA 136 134*  K 4.0 3.9  CL 102 99  CO2 29 29  GLUCOSE 89 93  BUN 9 6  CREATININE 0.46* 0.52  CALCIUM 8.3* 8.6    Micro Results: Recent Results (from the past 240 hour(s))  CULTURE, BLOOD (ROUTINE X 2)     Status: None   Collection Time    07/18/12  9:01 AM      Result Value Range Status   Specimen Description BLOOD LEFT ARM   Final   Special Requests     Final   Value: BOTTLES DRAWN AEROBIC AND ANAEROBIC 7.5 CC BOTH BOTTLES   Culture  Setup Time 07/18/2012 17:07   Final   Culture     Final   Value:        BLOOD CULTURE RECEIVED NO GROWTH TO DATE CULTURE WILL BE HELD FOR 5 DAYS BEFORE ISSUING A FINAL NEGATIVE REPORT   Report Status PENDING   Incomplete  CULTURE, BLOOD (ROUTINE X 2)     Status: None   Collection Time    07/18/12  9:08 AM      Result Value Range Status   Specimen Description BLOOD LEFT HAND   Final   Special Requests     Final   Value: BOTTLES DRAWN AEROBIC AND ANAEROBIC 4.5CC BOTH BOTTLES   Culture  Setup Time 07/18/2012 17:07   Final   Culture     Final   Value:        BLOOD CULTURE RECEIVED NO GROWTH TO DATE CULTURE WILL BE HELD FOR 5 DAYS BEFORE ISSUING A FINAL NEGATIVE REPORT   Report Status PENDING   Incomplete  CULTURE, EXPECTORATED SPUTUM-ASSESSMENT     Status: None   Collection Time    07/18/12  1:31 PM      Result Value Range Status   Specimen Description SPUTUM   Final   Special Requests NONE   Final   Sputum evaluation     Final   Value: THIS SPECIMEN IS ACCEPTABLE. RESPIRATORY CULTURE REPORT  TO FOLLOW.   Report Status 07/18/2012 FINAL   Final  CULTURE, RESPIRATORY (NON-EXPECTORATED)     Status: None   Collection Time    07/18/12  1:31 PM      Result Value Range Status   Specimen Description SPU   Final   Special Requests NONE   Final   Gram Stain     Final   Value: FEW WBC PRESENT, PREDOMINANTLY PMN     RARE SQUAMOUS EPITHELIAL CELLS PRESENT     NO ORGANISMS SEEN   Culture FEW CANDIDA ALBICANS   Final   Report Status 07/20/2012 FINAL   Final    Studies/Results: Dg Chest 2 View  07/22/2012   *RADIOLOGY REPORT*  Clinical Data: Cough, history of sickle cell disease  CHEST - 2 VIEW  Comparison: 07/10/2012; 07/18/2012; 07/16/2012; chest CT - 07/16/2012  Findings:  Grossly unchanged enlarged cardiac silhouette.  Interval placement of right upper extremity approach PICC line with tip projected over the superior cavoatrial junction.  Grossly unchanged rather extensive bilateral mid and lower lung heterogeneous air space opacities, left greater than right.  Trace bilateral effusions are suspected, left greater than right.  No pneumothorax.  No definite evidence of edema.  Unchanged bones.  Post cholecystectomy.  IMPRESSION: 1.  Right upper extremity approach PICC line tip projects of the superior cava junction. 2.  Grossly unchanged rather extensive bilateral mid and lower lung airspace opacities, left greater than right, worrisome for multifocal infection.   Original Report Authenticated By: Tacey Ruiz, MD   Ct Angio Chest Pe W/cm &/or Wo Cm  07/22/2012   *RADIOLOGY REPORT*  Clinical Data: CT in the setting of tachycardia and chest pain, evaluate for pulmonary embolus  CT ANGIOGRAPHY CHEST  Technique:  Multidetector CT imaging of the chest using the standard protocol during bolus administration of intravenous contrast. Multiplanar reconstructed images including MIPs were obtained and reviewed to evaluate the vascular anatomy.  Contrast: OMNIPAQUE IOHEXOL 350 MG/ML SOLN  Comparison:  Chest x-ray obtained earlier today at 08:21 a.m.; prior CT scan of the chest 07/16/2012  Findings:  Mediastinum: Unremarkable CT appearance of the thyroid gland. Similar appearance of upper mediastinal adenopathy.  An index high prevascular lymph node measures 9 mm in short axis which is stable compared to the recent prior examination. A high right paratracheal lymph node measures 11 mm in short axis on image 10 of series 4. Unremarkable appearance of the thoracic esophagus.  Heart/Vascular: Relatively poor evaluation of the pulmonary arterial tree secondary to a combination of extensive respiratory motion in the lower lungs and marginal opacification of the pulmonary arteries secondary to contrast bolus timing.  No large central, lobe are or proximal segmental pulmonary emboli.  More distal segmental and subsegmental evaluation is significantly limited. There is a bovine configuration of the aortic arch (two vessel arch with common origin of the brachiocephalic and left common carotid arteries), a normal anatomic variant.  Similar degree of cardiomegaly with left ventricular prominence.  There may be a trace dependent pericardial effusion.  Lungs/Pleura: Progressive diffuse ground-glass attenuation airspace disease in the bilateral lower lobes, inferior right upper lobe and the inferior left upper lobe with areas of patchy ground-glass attenuation opacity in a bronchovascular distribution predominately in the bilateral lower lobes.  New small left pleural effusion.  Upper Abdomen: Calcified partially auto infarcted spleen. Otherwise, the upper abdomen is unremarkable.  Bones: Levoconvex scoliosis of the upper thoracic spine. No acute fracture or aggressive appearing lytic or blastic osseous lesion.  IMPRESSION:  1.  Negative for central, lobar or proximal segmental  pulmonary emboli.  Evaluation of the more distal pulmonary arterial tree is limited by significant respiratory motion and contrast bolus timing.  2.  Marked interval progression of a combination of ground-glass attenuation airspace opacity and patchy areas of consolidation in the dependent aspects of the bilateral upper and lower lobes. Airspace disease is most marked in the lower lobes and demonstrates a peribronchovascular distribution.  Primary considerations include aspiration pneumonia/pneumonitis, multi focal pneumonia, and potentially veno occlusive disease given the patient's history of sickle cell anemia.  3. Stable cardiomegaly likely related to elevated cardiac output in the setting of chronic anemia.  4.  New small left pleural effusion.  5.  Stable upper mediastinal adenopathy, likely reactive.  6. Trace dependent pericardial effusion.   Original Report Authenticated By: Malachy Moan, M.D.   Ir Fluoro Guide Cv Line Right  07/22/2012   *RADIOLOGY REPORT*  Clinical Data: Sickle cell anemia and need for IV antibiotic therapy.  The patient has bilateral upper extremity DVT related to prior PICC line placement and requires a jugular tunneled catheter.  TUNNELED CENTRAL VENOUS CATHETER PLACEMENT WITH ULTRASOUND AND FLUOROSCOPIC GUIDANCE  Fluoroscopy Time:  12 seconds.  Procedure:  The procedure, risks, benefits, and alternatives were explained to the patient.  Questions regarding the procedure were encouraged and answered.  The patient understands and consents to the procedure.  The right neck and chest were prepped with chlorhexidine in a sterile fashion, and a sterile drape was applied covering the operative field.  Maximum barrier sterile technique with sterile gowns and gloves were used for the procedure.  Local anesthesia was provided with 1% lidocaine.  Ultrasound was used to confirm patency of the right internal jugular vein.  After creating a small venotomy incision, a 21 gauge needle was advanced into the the right internal jugular vein under direct, real-time ultrasound guidance.  Ultrasound image documentation was performed.  A 6-French  tunneled dual lumen power line measuring 21 cm from tip to cuff was chosen for placement.  This was tunneled in a retrograde fashion from the chest wall to the venotomy incision.  At the venotomy, a 6 Fr peel-away sheath was placed over a guidewire.  The catheter was then placed through the sheath and the sheath removed.  Final catheter positioning was confirmed and documented with a fluoroscopic spot image.  The catheter was aspirated and flushed with saline.  The venotomy incision was closed with subcutaneous 4-0 Vicryl. Dermabond was applied to the incision.  The catheter exit site was secured with 0-Prolene retention sutures.  Complications: None.  No pneumothorax.  Findings:  After catheter placement, the tip lies at the cavoatrial junction.  The catheter aspirates normally and is ready for immediate use.  IMPRESSION:  Placement of tunneled 6-French dual lumen power line via the right internal jugular vein.  The catheter tip lies at the cavoatrial junction.  The catheter is ready for immediate use.   Original Report Authenticated By: Irish Lack, M.D.   Ir US Guide Vasc Access Right  07/22/2012   *RADIOLOGY REPORT*  Clinical Data: Sickle cell anemia and need for IV antibiotic therapy.  The patient has bilateral upper extremity DVT related to prior PICC line placement and requires a jugular tunneled catheter.  TUNNELED CENTRAL VENOUS CATHETER PLACEMENT WITH ULTRASOUND AND FLUOROSCOPIC GUIDANCE  Fluoroscopy Time:  12 seconds.  Procedure:  The procedure, risks, benefits, and alternatives were explained to the patient.  Questions regarding the procedure were encouraged and answered.  The patient understands and consents to the  procedure.  The right neck and chest were prepped with chlorhexidine in a sterile fashion, and a sterile drape was applied covering the operative field.  Maximum barrier sterile technique with sterile gowns and gloves were used for the procedure.  Local anesthesia was provided with 1%  lidocaine.  Ultrasound was used to confirm patency of the right internal jugular vein.  After creating a small venotomy incision, a 21 gauge needle was advanced into the the right internal jugular vein under direct, real-time ultrasound guidance.  Ultrasound image documentation was performed.  A 6-French tunneled dual lumen power line measuring 21 cm from tip to cuff was chosen for placement.  This was tunneled in a retrograde fashion from the chest wall to the venotomy incision.  At the venotomy, a 6 Fr peel-away sheath was placed over a guidewire.  The catheter was then placed through the sheath and the sheath removed.  Final catheter positioning was confirmed and documented with a fluoroscopic spot image.  The catheter was aspirated and flushed with saline.  The venotomy incision was closed with subcutaneous 4-0 Vicryl. Dermabond was applied to the incision.  The catheter exit site was secured with 0-Prolene retention sutures.  Complications: None.  No pneumothorax.  Findings:  After catheter placement, the tip lies at the cavoatrial junction.  The catheter aspirates normally and is ready for immediate use.  IMPRESSION:  Placement of tunneled 6-French dual lumen power line via the right internal jugular vein.  The catheter tip lies at the cavoatrial junction.  The catheter is ready for immediate use.   Original Report Authenticated By: Irish Lack, M.D.      Assessment/Plan: Cameron [name] is a 22 y.o. male  admitted with sickle cell crisis, who was treated also for CAP on admission though his CXR and CT were underwhelming with fever onset while in the hospital and progressive infiltrates RML Left mid LLung broadened to vanco, imipenem, and now fluconazole also with hip effusion and with ulcers on his left ankle  #1 FUO: Differential inlcudes HCAP for which he is on VERY broad spectrum antibiotics, his newly discovered DVT RUE,  hip effusion, left ankle ulcers.  I was unaware of his ankle ulcers on  the left, --- I am ordering MRI left ankle to look for deep infection  Will need to followup this study  Additionally if he continues to fever may need to aspirate the hip effusion and send it for  --cell count and differential --crystals,  --gram stain and culture     #2 HCAP  -continue imi/vanco  For now, given multi-lobar nature of infiltrates may want to consider complete a 14 TOTal day course of anti MRSA  Antibiotics  if we are convinced that this HCAP is the source of his fevers, infection  #3 Thrush: --continue fluconazole but use po    #4 Hip effusion: not obviously infected though If he continues to  Fever  thru current abx or has more pain there may need to consider IR guided aspiration for cell count and diff, crystals and culture   #5 Left ankle ulcers: image with MRI could be antoher source for fevers  #5 DVT: will need anticoagulation and this could be a reason for fevers despite abx, I WOULD NOT CHANGE OVER TO COUMADIN YET GIVEN CHANCE HE MAY NEED HIP ASPIRATED, OR ATTENTION TO THE LEFT ANKLE SURGICALLY IF HE HAS OSTEOMYELITIS THERE  #5 Screening: HIV negative  Dr. Ninetta Lights is covering and available this weekend for questions.  LOS: 7 days   Acey Lav 07/23/2012, 3:44 PM

## 2012-07-24 LAB — CBC WITH DIFFERENTIAL/PLATELET
Basophils Relative: 1 % (ref 0–1)
HCT: 18.2 % — ABNORMAL LOW (ref 39.0–52.0)
Hemoglobin: 6.1 g/dL — CL (ref 13.0–17.0)
Lymphs Abs: 7.8 10*3/uL — ABNORMAL HIGH (ref 0.7–4.0)
MCH: 31.6 pg (ref 26.0–34.0)
MCHC: 33.5 g/dL (ref 30.0–36.0)
Monocytes Absolute: 1.5 10*3/uL — ABNORMAL HIGH (ref 0.1–1.0)
nRBC: 18 /100 WBC — ABNORMAL HIGH

## 2012-07-24 LAB — CULTURE, BLOOD (ROUTINE X 2)

## 2012-07-24 LAB — PREPARE RBC (CROSSMATCH)

## 2012-07-24 LAB — EXPECTORATED SPUTUM ASSESSMENT W GRAM STAIN, RFLX TO RESP C

## 2012-07-24 MED ORDER — RIVAROXABAN 20 MG PO TABS: 20.0000 mg | ORAL_TABLET | Freq: Every day | ORAL | Status: DC

## 2012-07-24 MED ORDER — RIVAROXABAN 15 MG PO TABS
15.0000 mg | ORAL_TABLET | Freq: Two times a day (BID) | ORAL | Status: DC
  Administered 2012-07-24 – 2012-07-26 (×4): 15 mg via ORAL
  Filled 2012-07-24 (×6): qty 1

## 2012-07-24 NOTE — Progress Notes (Signed)
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 Patient has been having fever of unknown origin. His fever, has come down with MAXIMUM TEMPERATURE of 101 in the last 24 hours. He has also been no pain in his hips today. He is not requiring much of pain medicine. His heart rate has also slowed down to around 100-101. He has his left ankle wound which is being treated at the wound center. He is also scheduled to have MRI of the left ankle but he does not really want to have it done because he is following up with the wound Center and he feels like is getting better.  Objective: Vital signs in last 24 hours: Temp:  [98.5 F (36.9 C)-100.9 F (38.3 C)] 98.5 F (36.9 C) (06/21 0441) Pulse Rate:  [92-101] 101 (06/21 0441) Resp:  [18-19] 19 (06/21 0441) BP: (128-130)/(65-76) 130/65 mmHg (06/21 0441) SpO2:  [90 %-95 %] 93 % (06/21 0441) Weight change:  Last BM Date: 07/23/12  Intake/Output from previous day: 06/20 0701 - 06/21 0700 In: 2080 [P.O.:1000; I.V.:280; IV Piggyback:800] Out: 2550 [Urine:2550] Intake/Output this shift: Total I/O In: 1000 [IV Piggyback:1000] Out: -   General appearance: alert, cooperative and mild distress Eyes: conjunctivae/corneas clear. PERRL, EOM's intact. Fundi benign. Throat: abnormal findings: thrush Neck: no adenopathy, no carotid bruit, no JVD, supple, symmetrical, trachea midline and thyroid not enlarged, symmetric, no tenderness/mass/nodules Resp: rales bilaterally and rhonchi bibasilar Chest wall: no tenderness Cardio: Sinus tachycardia GI: soft, non-tender; bowel sounds normal; no masses,  no organomegaly Extremities: extremities normal, atraumatic, no cyanosis or edema Pulses: 2+ and symmetric Skin: Skin color, texture, turgor normal. No rashes or lesions Neurologic: Grossly normal  Lab Results:  Recent Labs   07/22/12 0455 07/24/12 0405  WBC 13.4* 17.2*  HGB 7.2* 6.1*  HCT 20.5* 18.2*  PLT 194 383   BMET  Recent Labs  07/22/12 0455 07/23/12 0315  NA 136 134*  K 4.0 3.9  CL 102 99  CO2 29 29  GLUCOSE 89 93  BUN 9 6  CREATININE 0.46* 0.52  CALCIUM 8.3* 8.6    Studies/Results: Ct Angio Chest Pe W/cm &/or Wo Cm  07/22/2012   *RADIOLOGY REPORT*  Clinical Data: CT in the setting of tachycardia and chest pain, evaluate for pulmonary embolus  CT ANGIOGRAPHY CHEST  Technique:  Multidetector CT imaging of the chest using the standard protocol during bolus administration of intravenous contrast. Multiplanar reconstructed images including MIPs were obtained and reviewed to evaluate the vascular anatomy.  Contrast: OMNIPAQUE IOHEXOL 350 MG/ML SOLN  Comparison: Chest x-ray obtained earlier today at 08:21 a.m.; prior CT scan of the chest 07/16/2012  Findings:  Mediastinum: Unremarkable CT appearance of the thyroid gland. Similar appearance of upper mediastinal adenopathy.  An index high prevascular lymph node measures 9 mm in short axis which is stable compared to the recent prior examination. A high right paratracheal lymph node measures 11 mm in short axis on image 10 of series 4. Unremarkable appearance of the thoracic esophagus.  Heart/Vascular: Relatively poor evaluation of the pulmonary arterial tree secondary to a combination of extensive respiratory motion in the lower lungs and marginal opacification of the pulmonary arteries secondary to contrast bolus timing.  No large central, lobe are or proximal segmental pulmonary emboli.  More distal segmental and subsegmental evaluation is significantly limited. There is a bovine configuration of the aortic arch (two vessel arch with common origin of  the brachiocephalic and left common carotid arteries), a normal anatomic variant.  Similar degree of cardiomegaly with left ventricular prominence.  There may be a trace dependent pericardial effusion.   Lungs/Pleura: Progressive diffuse ground-glass attenuation airspace disease in the bilateral lower lobes, inferior right upper lobe and the inferior left upper lobe with areas of patchy ground-glass attenuation opacity in a bronchovascular distribution predominately in the bilateral lower lobes.  New small left pleural effusion.  Upper Abdomen: Calcified partially auto infarcted spleen. Otherwise, the upper abdomen is unremarkable.  Bones: Levoconvex scoliosis of the upper thoracic spine. No acute fracture or aggressive appearing lytic or blastic osseous lesion.  IMPRESSION:  1.  Negative for central, lobar or proximal segmental pulmonary emboli.  Evaluation of the more distal pulmonary arterial tree is limited by significant respiratory motion and contrast bolus timing.  2. Marked interval progression of a combination of ground-glass attenuation airspace opacity and patchy areas of consolidation in the dependent aspects of the bilateral upper and lower lobes. Airspace disease is most marked in the lower lobes and demonstrates a peribronchovascular distribution.  Primary considerations include aspiration pneumonia/pneumonitis, multi focal pneumonia, and potentially veno occlusive disease given the patient's history of sickle cell anemia.  3. Stable cardiomegaly likely related to elevated cardiac output in the setting of chronic anemia.  4.  New small left pleural effusion.  5.  Stable upper mediastinal adenopathy, likely reactive.  6. Trace dependent pericardial effusion.   Original Report Authenticated By: Malachy Moan, M.D.   Ir Fluoro Guide Cv Line Right  07/22/2012   *RADIOLOGY REPORT*  Clinical Data: Sickle cell anemia and need for IV antibiotic therapy.  The patient has bilateral upper extremity DVT related to prior PICC line placement and requires a jugular tunneled catheter.  TUNNELED CENTRAL VENOUS CATHETER PLACEMENT WITH ULTRASOUND AND FLUOROSCOPIC GUIDANCE  Fluoroscopy Time:  12 seconds.   Procedure:  The procedure, risks, benefits, and alternatives were explained to the patient.  Questions regarding the procedure were encouraged and answered.  The patient understands and consents to the procedure.  The right neck and chest were prepped with chlorhexidine in a sterile fashion, and a sterile drape was applied covering the operative field.  Maximum barrier sterile technique with sterile gowns and gloves were used for the procedure.  Local anesthesia was provided with 1% lidocaine.  Ultrasound was used to confirm patency of the right internal jugular vein.  After creating a small venotomy incision, a 21 gauge needle was advanced into the the right internal jugular vein under direct, real-time ultrasound guidance.  Ultrasound image documentation was performed.  A 6-French tunneled dual lumen power line measuring 21 cm from tip to cuff was chosen for placement.  This was tunneled in a retrograde fashion from the chest wall to the venotomy incision.  At the venotomy, a 6 Fr peel-away sheath was placed over a guidewire.  The catheter was then placed through the sheath and the sheath removed.  Final catheter positioning was confirmed and documented with a fluoroscopic spot image.  The catheter was aspirated and flushed with saline.  The venotomy incision was closed with subcutaneous 4-0 Vicryl. Dermabond was applied to the incision.  The catheter exit site was secured with 0-Prolene retention sutures.  Complications: None.  No pneumothorax.  Findings:  After catheter placement, the tip lies at the cavoatrial junction.  The catheter aspirates normally and is ready for immediate use.  IMPRESSION:  Placement of tunneled 6-French dual lumen power line via the right internal jugular  vein.  The catheter tip lies at the cavoatrial junction.  The catheter is ready for immediate use.   Original Report Authenticated By: Irish Lack, M.D.   Ir US Guide Vasc Access Right  07/22/2012   *RADIOLOGY REPORT*  Clinical  Data: Sickle cell anemia and need for IV antibiotic therapy.  The patient has bilateral upper extremity DVT related to prior PICC line placement and requires a jugular tunneled catheter.  TUNNELED CENTRAL VENOUS CATHETER PLACEMENT WITH ULTRASOUND AND FLUOROSCOPIC GUIDANCE  Fluoroscopy Time:  12 seconds.  Procedure:  The procedure, risks, benefits, and alternatives were explained to the patient.  Questions regarding the procedure were encouraged and answered.  The patient understands and consents to the procedure.  The right neck and chest were prepped with chlorhexidine in a sterile fashion, and a sterile drape was applied covering the operative field.  Maximum barrier sterile technique with sterile gowns and gloves were used for the procedure.  Local anesthesia was provided with 1% lidocaine.  Ultrasound was used to confirm patency of the right internal jugular vein.  After creating a small venotomy incision, a 21 gauge needle was advanced into the the right internal jugular vein under direct, real-time ultrasound guidance.  Ultrasound image documentation was performed.  A 6-French tunneled dual lumen power line measuring 21 cm from tip to cuff was chosen for placement.  This was tunneled in a retrograde fashion from the chest wall to the venotomy incision.  At the venotomy, a 6 Fr peel-away sheath was placed over a guidewire.  The catheter was then placed through the sheath and the sheath removed.  Final catheter positioning was confirmed and documented with a fluoroscopic spot image.  The catheter was aspirated and flushed with saline.  The venotomy incision was closed with subcutaneous 4-0 Vicryl. Dermabond was applied to the incision.  The catheter exit site was secured with 0-Prolene retention sutures.  Complications: None.  No pneumothorax.  Findings:  After catheter placement, the tip lies at the cavoatrial junction.  The catheter aspirates normally and is ready for immediate use.  IMPRESSION:  Placement of  tunneled 6-French dual lumen power line via the right internal jugular vein.  The catheter tip lies at the cavoatrial junction.  The catheter is ready for immediate use.   Original Report Authenticated By: Irish Lack, M.D.    Medications: I have reviewed the patient's current medications.  Assessment/Plan: 22 year old gentleman with community-acquired pneumonia and sickle cell painful crisis now with fever, left hip pain oral thrush.  #1 left hip pain: Seems resolved. We will therefore follow closely and continue pain medication as needed.  #2 sickle cell painful crisis: Continue his current treatment. Not much pain today.  #3 community-acquired pneumonia: Continue current antibiotics. His fever curve is steadily coming down. He would probably need antibiotics at home for 2-3 more weeks depending on ID recommendations  #4 leukocytosis: Has worsened most likely from the DVT. We'll keep an eye on his white count.  #5 sickle cell anemia: Hemoglobin has dropped further to 6.2 today. We will transfuse him 1-2 units of packed red blood cells.  #6 hypertension: Continue the lisinopril. Blood pressure has been well controlled  #7 constipation: Seems resolved now. Continue to do laxatives.  #8 right upper extremity DVT: On Lovenox. Will switch him to Xarelto today    LOS: 8 days    Twan Harkin,LAWAL 07/24/2012, 11:50 AM

## 2012-07-24 NOTE — Progress Notes (Signed)
ANTICOAGULATION CONSULT NOTE - Initial Consult  Pharmacy Consult for Xarelto (change from Lovenox) Indication: new LE DVT  No Known Allergies  Patient Measurements: Height: 5\' 5"  (165.1 cm) Weight: 130 lb (58.968 kg) IBW/kg (Calculated) : 61.5  Vital Signs: Temp: 98.5 F (36.9 C) (06/21 0441) Temp src: Oral (06/21 0441) BP: 130/65 mmHg (06/21 0441) Pulse Rate: 101 (06/21 0441)  Labs:  Recent Labs  07/22/12 0455 07/23/12 0315 07/24/12 0405  HGB 7.2*  --  6.1*  HCT 20.5*  --  18.2*  PLT 194  --  383  CREATININE 0.46* 0.52  --     Estimated Creatinine Clearance: 121.9 ml/min (by C-G formula based on Cr of 0.52).   Medical History: Past Medical History  Diagnosis Date  . Sickle cell anemia   . Hypertension   . Asthma      Assessment: 22 yo M with SCD admitted 6/13 with [place] pain crisis.  Bilateral upper extremity venous duplex today positive for DVT in the R brachial vein surrounding PICC line and superficial vein thrombosis in L basilic vein.  Lovenox started 60mg  q12h on 6/20, now to change to Xarelto  Scr wnl, CrCl >100 ml/min  No bleeding reported, H/H low ( anemia). Pltc ok  No drug IX w/ xarelto identified. Note use of Ibuprofen may increase risk of GIB in setting of anticoagulation.  Goal of Therapy:  Anti-Xa level 0.6-1.2 units/ml 4hrs after LMWH dose given Monitor platelets by anticoagulation protocol: Yes   Plan:   Xarelto 15mg  BIDWC x 3 weeks then 20mg  daily w/ meal  Monitor labs  Gwen Her PharmD  [phone] 07/24/2012 12:36 PM

## 2012-07-24 NOTE — Progress Notes (Signed)
Clarified order to Transfuse 2 units RBC total for today with Dr. Mikeal Hawthorne.

## 2012-07-24 NOTE — Progress Notes (Signed)
CRITICAL VALUE ALERT  Critical value received:  Hgb 6.1  Date of notification:  07-24-2012  Time of notification:  0430am  Critical value read back:yes  Nurse who received alert:  C. Gerilyn Pilgrim, RN  MD notified (1st page):  Benedetto Coons, NP  Time of first page:  0515am  MD notified (2nd page):  Time of second page:  Responding MD:  Benedetto Coons, NP  Time MD responded:  [phone]

## 2012-07-24 NOTE — Progress Notes (Signed)
ANTIBIOTIC CONSULT NOTE - FOLLOW UP  Pharmacy Consult for Vancomycin Indication: multifocal PNA  No Known Allergies  Patient Measurements: Height: 5\' 5"  (165.1 cm) Weight: 130 lb (58.968 kg) IBW/kg (Calculated) : 61.5  Vital Signs: Temp: 98.3 F (36.8 C) (06/21 1500) Temp src: Oral (06/21 1500) BP: 127/64 mmHg (06/21 1500) Pulse Rate: 102 (06/21 1500) Intake/Output from previous day: 06/20 0701 - 06/21 0700 In: 2080 [P.O.:1000; I.V.:280; IV Piggyback:800] Out: 2550 [Urine:2550] Intake/Output from this shift: Total I/O In: 1372.5 [P.O.:360; Blood:12.5; IV Piggyback:1000] Out: 1125 [Urine:1125]  Labs:  Recent Labs  07/22/12 0455 07/23/12 0315 07/24/12 0405  WBC 13.4*  --  17.2*  HGB 7.2*  --  6.1*  PLT 194  --  383  CREATININE 0.46* 0.52  --    Estimated Creatinine Clearance: 121.9 ml/min (by C-G formula based on Cr of 0.52).  Recent Labs  07/22/12 1300 07/24/12 1455  VANCOTROUGH 12.3 15.6     Assessment: 22 yo M with SCD admitted 6/13 with [place] pain crisis. CXR on admit with no evidence of infection but pt found tachycardic, hypoxic, elevated WBC - CT with patchy distribution of atelectasis vs infiltration in the lungs. Levaquin started for r/o CAP without improvement. On 6/15, WBC continued to rise, pt febrile, anemia and thrombocytopenia worsened, CXR with progression of infection - worried for sepsis, abx broadened as follow .Marland Kitchen    6/13 CTX, azithro x 1  6/13 >> Levaquin >> 6/15 6/15 >> Vancomycin >>  6/15 >> Primaxin >>  6/15 >> Azithro >> 6/17 6/17 >> Fluconazole (thrush) >>   Today is D#7 Vancomycin and Primaxin. D#5 Fluconazole, D#9 total antibiotics.  Tmax 100.9, WBC up to 17.2, CrCl > 100 ml/min.    Blood cultures negative to date.   Sputum growing Candida albicans - not a pathogen per ID.  CXR 6/19 extensive airspace opacities, worrisome for multifocal infection. New DVT found today - possible cause of fever. Per ID, given multi-lobar nature of  infiltrates may want to consider complete a course for MRSA PNA with Zyvox vs Doxy once he is stable. If fever not improve with anticoagulation for DVT, may need bronchoscopy.  Hip effusion on MRI and shoulder pain - not obviously infected but if continues to have fever/pain may need IR guided aspiration/culture of effusion and imaging of shoulder.   Vancomycin trough today 15.6 on Vancomycin 1500 mg IV q8h.    Goal of Therapy:  Vancomycin trough level 15-20 mcg/ml  Plan:   Continue Vancomycin 1500 mg IV q8h.    Continue Primaxin 500 mg IV q8h.   Pharmacy will cont to f/u.  Charolotte Eke, PharmD, pager [phone]. 07/24/2012,4:00 PM.

## 2012-07-25 LAB — TYPE AND SCREEN
Antibody Screen: NEGATIVE
Unit division: 0
Unit division: 0

## 2012-07-25 LAB — CBC WITH DIFFERENTIAL/PLATELET
Basophils Absolute: 0.1 10*3/uL (ref 0.0–0.1)
Eosinophils Absolute: 0.4 10*3/uL (ref 0.0–0.7)
Eosinophils Relative: 3 % (ref 0–5)
MCH: 31.1 pg (ref 26.0–34.0)
Monocytes Absolute: 1.8 10*3/uL — ABNORMAL HIGH (ref 0.1–1.0)
Neutrophils Relative %: 65 % (ref 43–77)
Platelets: 706 10*3/uL — ABNORMAL HIGH (ref 150–400)
RBC: 3.09 MIL/uL — ABNORMAL LOW (ref 4.22–5.81)
RDW: 20.8 % — ABNORMAL HIGH (ref 11.5–15.5)

## 2012-07-25 LAB — COMPREHENSIVE METABOLIC PANEL
Albumin: 2.8 g/dL — ABNORMAL LOW (ref 3.5–5.2)
BUN: 7 mg/dL (ref 6–23)
Calcium: 9.1 mg/dL (ref 8.4–10.5)
Creatinine, Ser: 0.55 mg/dL (ref 0.50–1.35)
GFR calc Af Amer: >90 mL/min (ref >90)
Glucose, Bld: 88 mg/dL (ref 70–99)
Total Protein: 7.2 g/dL (ref 6.0–8.3)

## 2012-07-25 NOTE — Progress Notes (Signed)
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 Most likely due to his Sickle cell painful crisis and DVT. No SOB, No NVD. No cough. Able to walk without much support.  Objective: Vital signs in last 24 hours: Temp:  [98.2 F (36.8 C)-100.1 F (37.8 C)] 98.3 F (36.8 C) (06/22 0718) Pulse Rate:  [81-108] 81 (06/22 0718) Resp:  [18-20] 20 (06/22 0718) BP: (122-135)/(62-75) 134/75 mmHg (06/22 0718) SpO2:  [92 %-97 %] 97 % (06/22 0718) Weight change:  Last BM Date: 07/23/12  Intake/Output from previous day: 06/21 0701 - 06/22 0700 In: 4044.7 [P.O.:1120; I.V.:796.3; Blood:328.3; IV Piggyback:1800] Out: 5300 [Urine:5300] Intake/Output this shift:    General appearance: alert, cooperative and mild distress Eyes: conjunctivae/corneas clear. PERRL, EOM's intact. Fundi benign. Throat: abnormal findings: thrush Neck: no adenopathy, no carotid bruit, no JVD, supple, symmetrical, trachea midline and thyroid not enlarged, symmetric, no tenderness/mass/nodules Resp: rales bilaterally and rhonchi bibasilar Chest wall: no tenderness Cardio: Sinus tachycardia GI: soft, non-tender; bowel sounds normal; no masses,  no organomegaly Extremities: extremities normal, atraumatic, no cyanosis or edema Pulses: 2+ and symmetric Skin: Skin color, texture, turgor normal. No rashes or lesions Neurologic: Grossly normal  Lab Results:  Recent Labs  07/24/12 0405 07/25/12 0544  WBC 17.2* 13.6*  HGB 6.1* 9.6*  HCT 18.2* 28.2*  PLT 383 706*   BMET  Recent Labs  07/23/12 0315 07/25/12 0544  NA 134* 133*  K 3.9 4.3  CL 99 100  CO2 29 27  GLUCOSE 93 88  BUN 6 7  CREATININE 0.52 0.55  CALCIUM 8.6 9.1    Studies/Results: No results found.  Medications: I have reviewed the patient's current medications.  Assessment/Plan: 22 year old gentleman  with community-acquired pneumonia and sickle cell painful crisis now with fever, left hip pain oral thrush.  #1 left hip pain: Resosolved. Continue mobilization.  #2 sickle cell painful crisis: Continue his current treatment. Not much pain today.  #3 community-acquired pneumonia: Continue current antibiotics. His fever is resolved and pneumonia seems adequately treated. Continue per Id recommendation.  #4 leukocytosis:Much improved today.  #5 sickle cell anemia:S/P transfusion 2 units PRBC. His hemoglobin is now stable.  #6 hypertension: Continue the lisinopril. Blood pressure has been well controlled  #7 constipation: Seems resolved now. Continue to do laxatives.  #8 right upper extremity DVT: On Xarelto. Continue.    LOS: 9 days    GARBA,LAWAL 07/25/2012, 8:18 AM

## 2012-07-26 MED ORDER — RIVAROXABAN 15 MG PO TABS
15.0000 mg | ORAL_TABLET | Freq: Two times a day (BID) | ORAL | Status: DC
  Administered 2012-07-26: 15 mg via ORAL
  Filled 2012-07-26 (×4): qty 1

## 2012-07-26 MED ORDER — FOLIC ACID 1 MG PO TABS: 1.0000 mg | ORAL_TABLET | Freq: Every day | ORAL | Status: DC

## 2012-07-26 MED ORDER — RIVAROXABAN 15 MG PO TABS: 15.0000 mg | ORAL_TABLET | Freq: Two times a day (BID) | ORAL | Status: DC

## 2012-07-26 MED ORDER — LEVOFLOXACIN 750 MG PO TABS: 750.0000 mg | ORAL_TABLET | Freq: Every day | ORAL | Status: DC

## 2012-07-26 MED ORDER — RIVAROXABAN 20 MG PO TABS: 20.0000 mg | ORAL_TABLET | Freq: Every day | ORAL | Status: DC

## 2012-07-26 NOTE — Progress Notes (Signed)
Discharge instructions explained with teach back, instructed to pick up prescriptions at [facility] [address]. Patient verbalizes understanding of same. Stable for discharge.

## 2012-07-26 NOTE — Progress Notes (Signed)
Patient ID: Cameron [name], male   DOB: 30-Jan-1991, 22 y.o.   MRN: [id number]         [facility]    Date of Admission:  07/16/2012   Total days of antibiotics 13        Day 9 vancomycin        Day 9 imipenem        Day 7 fluconazole Principal Problem:   Fever, unspecified Active Problems:   Hypertension   Leukocytosis   Sickle cell pain crisis   CAP (community acquired pneumonia)   Sickle cell anemia   DVT (deep venous thrombosis)   . fluconazole  100 mg Oral Daily  . folic acid  1 mg Oral Daily  . hydroxyurea  500 mg Oral Daily  . imipenem-cilastatin  500 mg Intravenous Q8H  . lisinopril  10 mg Oral Daily  . oxyCODONE  10 mg Oral Q4H  . polyethylene glycol  17 g Oral Daily  . [START ON 08/15/2012] rivaroxaban  20 mg Oral Q breakfast   Followed by  . Rivaroxaban  15 mg Oral BID WC  . vancomycin  1,500 mg Intravenous Q8H    Subjective: He is feeling much better. He is having only infrequent dry cough now. He denies shortness of breath. He was treated about one month ago with 2 different antibiotics for wound infection of his left ankle ulcer. The appearance improved significantly. Therefore, he did not feel he wanted to have an MRI done during this admission. He is eager to go home. Review of Systems: Pertinent items are noted in HPI.  Past Medical History  Diagnosis Date  . Sickle cell anemia   . Hypertension   . Asthma     History  Substance Use Topics  . Smoking status: Never Smoker   . Smokeless tobacco: Never Used  . Alcohol Use: No    History reviewed. No pertinent family history.  No Known Allergies  Objective: Temp:  [98.5 F (36.9 C)-99.6 F (37.6 C)] 98.5 F (36.9 C) (06/23 5366) Pulse Rate:  [86-92] 91 (06/23 0638) Resp:  [16-20] 20 (06/23 4403) BP: (123-141)/(59-85) 123/85 mmHg (06/23 4742) SpO2:  [96 %-98 %] 96 % (06/23 5956)  General: He is alert, in good spirits and in no distress Oral: Thrush has  resolved Skin: No rash. Right anterior chest Port-A-Cath site appears normal Lungs: Clear Cor: Regular S1 and S2 no murmurs Abdomen: Soft and nontender Left ankle ulcer does not appear infected  Lab Results Lab Results  Component Value Date   WBC 13.6* 07/25/2012   HGB 9.6* 07/25/2012   HCT 28.2* 07/25/2012   MCV 91.3 07/25/2012   PLT 706* 07/25/2012    Lab Results  Component Value Date   CREATININE 0.55 07/25/2012   BUN 7 07/25/2012   NA 133* 07/25/2012   K 4.3 07/25/2012   CL 100 07/25/2012   CO2 27 07/25/2012    Lab Results  Component Value Date   ALT 21 07/25/2012   AST 26 07/25/2012   ALKPHOS 79 07/25/2012   BILITOT 1.8* 07/25/2012      Microbiology: Recent Results (from the past 240 hour(s))  CULTURE, BLOOD (ROUTINE X 2)     Status: None   Collection Time    07/18/12  9:01 AM      Result Value Range Status   Specimen Description BLOOD LEFT ARM   Final   Special Requests     Final   Value: BOTTLES DRAWN  AEROBIC AND ANAEROBIC 7.5 CC BOTH BOTTLES   Culture  Setup Time 07/18/2012 17:07   Final   Culture NO GROWTH 5 DAYS   Final   Report Status 07/24/2012 FINAL   Final  CULTURE, BLOOD (ROUTINE X 2)     Status: None   Collection Time    07/18/12  9:08 AM      Result Value Range Status   Specimen Description BLOOD LEFT HAND   Final   Special Requests     Final   Value: BOTTLES DRAWN AEROBIC AND ANAEROBIC 4.5CC BOTH BOTTLES   Culture  Setup Time 07/18/2012 17:07   Final   Culture NO GROWTH 5 DAYS   Final   Report Status 07/24/2012 FINAL   Final  CULTURE, EXPECTORATED SPUTUM-ASSESSMENT     Status: None   Collection Time    07/18/12  1:31 PM      Result Value Range Status   Specimen Description SPUTUM   Final   Special Requests NONE   Final   Sputum evaluation     Final   Value: THIS SPECIMEN IS ACCEPTABLE. RESPIRATORY CULTURE REPORT TO FOLLOW.   Report Status 07/18/2012 FINAL   Final  CULTURE, RESPIRATORY (NON-EXPECTORATED)     Status: None   Collection Time     07/18/12  1:31 PM      Result Value Range Status   Specimen Description SPU   Final   Special Requests NONE   Final   Gram Stain     Final   Value: FEW WBC PRESENT, PREDOMINANTLY PMN     RARE SQUAMOUS EPITHELIAL CELLS PRESENT     NO ORGANISMS SEEN   Culture FEW CANDIDA ALBICANS   Final   Report Status 07/20/2012 FINAL   Final  URINE CULTURE     Status: None   Collection Time    07/22/12  7:18 PM      Result Value Range Status   Specimen Description URINE, RANDOM   Final   Special Requests NONE   Final   Culture  Setup Time 07/23/2012 01:28   Final   Colony Count NO GROWTH   Final   Culture NO GROWTH   Final   Report Status 07/23/2012 FINAL   Final  CULTURE, EXPECTORATED SPUTUM-ASSESSMENT     Status: None   Collection Time    07/24/12 12:07 PM      Result Value Range Status   Specimen Description SPUTUM   Final   Special Requests NONE   Final   Sputum evaluation     Final   Value: MICROSCOPIC FINDINGS SUGGEST THAT THIS SPECIMEN IS NOT REPRESENTATIVE OF LOWER RESPIRATORY SECRETIONS. PLEASE RECOLLECT.     Donna Bernard  NOTIFIED [id number] @ 1315 BY J SCOTTON   Report Status 07/24/2012 FINAL   Final    Studies/Results: No results found.  Assessment: His fevers have resolved and he has no current clinical evidence of active pneumonia. I will stop antibiotics now.  Plan: 1. Discontinue vancomycin, imipenem and fluconazole  Cliffton Asters, MD [facility] [phone] pager   [phone] cell 07/26/2012, 10:07 AM

## 2012-07-26 NOTE — Progress Notes (Signed)
Chart reviewed. Spoke with Misty Stanley, RN, CM with [facility] [phone] ext [phone], requesting clinical updates, pt's discharge plans and needs. At present time pt will not need IV ABX or Home Health.

## 2012-07-26 NOTE — Progress Notes (Signed)
IR unavailable to remove tunnelled PICC. Dr Mikeal Hawthorne notified and discharge cancelled.

## 2012-07-26 NOTE — Discharge Summary (Signed)
Physician Discharge Summary  Patient ID: Cameron [name] MRN: [id number] DOB/AGE: [birth year] 22 y.o.  Admit date: 07/16/2012 Discharge date: 07/26/2012  Admission Diagnoses:  Discharge Diagnoses:  Principal Problem:   Fever, unspecified Active Problems:   Hypertension   Leukocytosis   Sickle cell pain crisis   CAP (community acquired pneumonia)   Sickle cell anemia   DVT (deep venous thrombosis)   Discharged Condition: good  Hospital Course: A 22 year old gentleman admitted with sickle cell painful crises, hospital acquired pneumonia, and deep vein thrombosis. Patient was initially admitted mainly with fever and sickle cell painful crisis. During hospital evaluation he was found to have a pneumonia presumed community acquired but could have been hospital acquired as well. He was initiated on antibiotics as well as pain control. He went fever of 101 and above for a number of days. Infectious disease was consulted on further treatment was instituted. He however continued to have fever that was later present with secondary to sickle cell painful crisis. He also has a leukocytosis as well as hemolytic crisis. In the cause of his treatment he was thought to require home antibiotic therapy. He got a PICC line inserted in the right upper extremity but then developed deep vein thrombosis. The PICC line was then removed. Patient has been started on Xarelto therefore for the next 3-6 months. At the time of his discharge he has been back to his baseline. He is afebrile on oral antibiotics were stopped.  Consults: ID  Significant Diagnostic Studies: labs: Mainly CBCs CMP is. Also blood cultures x2. and radiology: CXR: infiltrates: lower lobe on the right  Treatments: IV hydration, antibiotics: vancomycin, Zosyn and Levaquin, analgesia: Dilaudid and blood transfusion.  Discharge Exam: Blood pressure 127/63, pulse 80, temperature 98.5 F (36.9 C), temperature source Oral, resp. rate 20, height 5\' 5"   (1.651 m), weight 58.968 kg (130 lb), SpO2 100.00%. General appearance: alert, cooperative, appears stated age and no distress Throat: lips, mucosa, and tongue normal; teeth and gums normal Neck: no adenopathy, no carotid bruit, no JVD, supple, symmetrical, trachea midline and thyroid not enlarged, symmetric, no tenderness/mass/nodules Back: symmetric, no curvature. ROM normal. No CVA tenderness. Resp: clear to auscultation bilaterally Chest wall: no tenderness Cardio: regular rate and rhythm, S1, S2 normal, no murmur, click, rub or gallop GI: soft, non-tender; bowel sounds normal; no masses,  no organomegaly Extremities: extremities normal, atraumatic, no cyanosis or edema Pulses: 2+ and symmetric Skin: Skin color, texture, turgor normal. No rashes or lesions Neurologic: Grossly normal  Disposition: 01-Home or Self Care     Medication List    TAKE these medications       albuterol 108 (90 BASE) MCG/ACT inhaler  Commonly known as:  PROVENTIL HFA;VENTOLIN HFA  Inhale 2 puffs into the lungs every 6 (six) hours as needed. For shortness of breath.     folic acid 1 MG tablet  Commonly known as:  FOLVITE  Take 1 tablet (1 mg total) by mouth daily.     hydroxyurea 500 MG capsule  Commonly known as:  HYDREA  Take 500 mg by mouth daily. May take with food to minimize GI side effects.     levofloxacin 750 MG tablet  Commonly known as:  LEVAQUIN  Take 1 tablet (750 mg total) by mouth daily.     lisinopril 10 MG tablet  Commonly known as:  PRINIVIL,ZESTRIL  Take 10 mg by mouth daily.     Rivaroxaban 15 MG Tabs tablet  Commonly known as:  XARELTO  Take  1 tablet (15 mg total) by mouth 2 (two) times daily with a meal.     ROXICODONE 5 MG immediate release tablet  Generic drug:  oxyCODONE  Take 5 mg by mouth every 4 (four) hours as needed for pain.     traMADol 50 MG tablet  Commonly known as:  ULTRAM  Take 50 mg by mouth every 6 (six) hours as needed for pain.            Follow-up Information   Follow up with No PCP Per Patient. Schedule an appointment as soon as possible for a visit in 2 weeks.   Contact information:   [address] [place] [address] [phone]       Signed: Lonia Blood 07/26/2012, 4:30 PM

## 2012-07-27 ENCOUNTER — Inpatient Hospital Stay (HOSPITAL_COMMUNITY): Payer: BC Managed Care – PPO

## 2012-07-27 NOTE — Procedures (Signed)
Successful removal of right IJ tunneled PICC. No complications

## 2012-08-12 ENCOUNTER — Encounter (HOSPITAL_BASED_OUTPATIENT_CLINIC_OR_DEPARTMENT_OTHER): Payer: BC Managed Care – PPO | Attending: Internal Medicine

## 2012-08-12 DIAGNOSIS — L97309 Non-pressure chronic ulcer of unspecified ankle with unspecified severity: Secondary | ICD-10-CM | POA: Insufficient documentation

## 2012-08-12 DIAGNOSIS — D571 Sickle-cell disease without crisis: Secondary | ICD-10-CM | POA: Insufficient documentation

## 2012-10-25 ENCOUNTER — Emergency Department (HOSPITAL_COMMUNITY): Payer: BC Managed Care – PPO

## 2012-10-25 ENCOUNTER — Encounter (HOSPITAL_COMMUNITY): Payer: Self-pay

## 2012-10-25 ENCOUNTER — Observation Stay (HOSPITAL_COMMUNITY)
Admission: EM | Admit: 2012-10-25 | Discharge: 2012-10-26 | Disposition: A | Discharge status: Home / Self Care | Payer: BC Managed Care – PPO | Attending: Internal Medicine | Admitting: Internal Medicine

## 2012-10-25 DIAGNOSIS — I10 Essential (primary) hypertension: Secondary | ICD-10-CM | POA: Insufficient documentation

## 2012-10-25 DIAGNOSIS — J45909 Unspecified asthma, uncomplicated: Secondary | ICD-10-CM | POA: Insufficient documentation

## 2012-10-25 DIAGNOSIS — D5700 Hb-SS disease with crisis, unspecified: Principal | ICD-10-CM

## 2012-10-25 DIAGNOSIS — D72829 Elevated white blood cell count, unspecified: Secondary | ICD-10-CM | POA: Insufficient documentation

## 2012-10-25 DIAGNOSIS — J189 Pneumonia, unspecified organism: Secondary | ICD-10-CM

## 2012-10-25 DIAGNOSIS — R0789 Other chest pain: Secondary | ICD-10-CM | POA: Insufficient documentation

## 2012-10-25 DIAGNOSIS — Z79899 Other long term (current) drug therapy: Secondary | ICD-10-CM | POA: Insufficient documentation

## 2012-10-25 DIAGNOSIS — R509 Fever, unspecified: Secondary | ICD-10-CM

## 2012-10-25 DIAGNOSIS — M549 Dorsalgia, unspecified: Secondary | ICD-10-CM

## 2012-10-25 DIAGNOSIS — I82409 Acute embolism and thrombosis of unspecified deep veins of unspecified lower extremity: Secondary | ICD-10-CM

## 2012-10-25 DIAGNOSIS — J9600 Acute respiratory failure, unspecified whether with hypoxia or hypercapnia: Secondary | ICD-10-CM

## 2012-10-25 DIAGNOSIS — L97401 Non-pressure chronic ulcer of unspecified heel and midfoot limited to breakdown of skin: Secondary | ICD-10-CM

## 2012-10-25 DIAGNOSIS — D571 Sickle-cell disease without crisis: Secondary | ICD-10-CM

## 2012-10-25 DIAGNOSIS — R079 Chest pain, unspecified: Secondary | ICD-10-CM

## 2012-10-25 LAB — CBC WITH DIFFERENTIAL/PLATELET
Eosinophils Absolute: 0.2 10*3/uL (ref 0.0–0.7)
Lymphs Abs: 1.5 10*3/uL (ref 0.7–4.0)
MCH: 32.1 pg (ref 26.0–34.0)
MCHC: 35.8 g/dL (ref 30.0–36.0)
Monocytes Absolute: 1.3 10*3/uL — ABNORMAL HIGH (ref 0.1–1.0)
Neutrophils Relative %: 82 % — ABNORMAL HIGH (ref 43–77)
Platelets: 665 10*3/uL — ABNORMAL HIGH (ref 150–400)
RDW: 26.7 % — ABNORMAL HIGH (ref 11.5–15.5)

## 2012-10-25 LAB — URINALYSIS, ROUTINE W REFLEX MICROSCOPIC
Bilirubin Urine: NEGATIVE
Glucose, UA: NEGATIVE mg/dL
Hgb urine dipstick: NEGATIVE
Protein, ur: NEGATIVE mg/dL
Specific Gravity, Urine: 1.02 (ref 1.005–1.030)
Urobilinogen, UA: 1 mg/dL (ref 0.0–1.0)

## 2012-10-25 LAB — HEPATIC FUNCTION PANEL
ALT: 25 U/L (ref 0–53)
AST: 50 U/L — ABNORMAL HIGH (ref 0–37)
Alkaline Phosphatase: 69 U/L (ref 39–117)
Indirect Bilirubin: 8.2 mg/dL — ABNORMAL HIGH (ref 0.3–0.9)
Total Protein: 8.2 g/dL (ref 6.0–8.3)

## 2012-10-25 LAB — BASIC METABOLIC PANEL
BUN: 9 mg/dL (ref 6–23)
CO2: 25 mEq/L (ref 19–32)
CO2: 28 mEq/L (ref 19–32)
Calcium: 8.8 mg/dL (ref 8.4–10.5)
Chloride: 93 mEq/L — ABNORMAL LOW (ref 96–112)
Chloride: 98 mEq/L (ref 96–112)
Creatinine, Ser: 0.52 mg/dL (ref 0.50–1.35)
Creatinine, Ser: 0.61 mg/dL (ref 0.50–1.35)
GFR calc Af Amer: >90 mL/min (ref >90)
GFR calc Af Amer: >90 mL/min (ref >90)
GFR calc non Af Amer: >90 mL/min (ref >90)
Glucose, Bld: 106 mg/dL — ABNORMAL HIGH (ref 70–99)
Potassium: 4.1 mEq/L (ref 3.5–5.1)
Potassium: 3.6 mEq/L (ref 3.5–5.1)
Sodium: 135 mEq/L (ref 135–145)

## 2012-10-25 LAB — POCT I-STAT, CHEM 8
BUN: 7 mg/dL (ref 6–23)
Chloride: 99 mEq/L (ref 96–112)
Creatinine, Ser: 5.1 mg/dL — ABNORMAL HIGH (ref 0.50–1.35)
Potassium: 4.2 mEq/L (ref 3.5–5.1)
Sodium: 136 mEq/L (ref 135–145)
TCO2: 26 mmol/L (ref 0–100)

## 2012-10-25 LAB — RETICULOCYTES: Retic Count, Absolute: 562.2 10*3/uL — ABNORMAL HIGH (ref 19.0–186.0)

## 2012-10-25 LAB — URINE MICROSCOPIC-ADD ON

## 2012-10-25 MED ORDER — INFLUENZA VAC SPLIT QUAD 0.5 ML IM SUSP
0.5000 mL | INTRAMUSCULAR | Status: AC
  Administered 2012-10-26: 0.5 mL via INTRAMUSCULAR
  Filled 2012-10-25 (×2): qty 0.5

## 2012-10-25 MED ORDER — ONDANSETRON HCL 4 MG/2ML IJ SOLN: 4.0000 mg | Freq: Three times a day (TID) | INTRAMUSCULAR | Status: DC | PRN

## 2012-10-25 MED ORDER — KETOROLAC TROMETHAMINE 30 MG/ML IJ SOLN
15.0000 mg | Freq: Four times a day (QID) | INTRAMUSCULAR | Status: DC | PRN
  Administered 2012-10-25 – 2012-10-26 (×2): 15 mg via INTRAVENOUS
  Filled 2012-10-25 (×4): qty 1

## 2012-10-25 MED ORDER — ENOXAPARIN SODIUM 40 MG/0.4ML ~~LOC~~ SOLN: 40.0000 mg | SUBCUTANEOUS | Status: DC

## 2012-10-25 MED ORDER — OXYCODONE HCL 5 MG PO TABS: 15.0000 mg | ORAL_TABLET | ORAL | Status: AC | PRN

## 2012-10-25 MED ORDER — SODIUM CHLORIDE 0.45 % IV SOLN
INTRAVENOUS | Status: DC
  Administered 2012-10-25: 18:00:00 via INTRAVENOUS

## 2012-10-25 MED ORDER — ONDANSETRON HCL 4 MG PO TABS: 4.0000 mg | ORAL_TABLET | Freq: Four times a day (QID) | ORAL | Status: DC | PRN

## 2012-10-25 MED ORDER — HEPARIN SODIUM (PORCINE) 5000 UNIT/ML IJ SOLN
5000.0000 [IU] | Freq: Three times a day (TID) | INTRAMUSCULAR | Status: DC
  Administered 2012-10-25 – 2012-10-26 (×2): 5000 [IU] via SUBCUTANEOUS
  Filled 2012-10-25 (×5): qty 1

## 2012-10-25 MED ORDER — SODIUM CHLORIDE 0.9 % IV BOLUS (SEPSIS)
1000.0000 mL | Freq: Once | INTRAVENOUS | Status: AC
  Administered 2012-10-25: 1000 mL via INTRAVENOUS

## 2012-10-25 MED ORDER — HYDROXYUREA 500 MG PO CAPS
500.0000 mg | ORAL_CAPSULE | Freq: Every day | ORAL | Status: DC
  Administered 2012-10-26: 500 mg via ORAL
  Filled 2012-10-25: qty 1

## 2012-10-25 MED ORDER — LISINOPRIL 10 MG PO TABS
10.0000 mg | ORAL_TABLET | Freq: Every day | ORAL | Status: DC
Start: 2012-10-25 — End: 2012-10-26
  Administered 2012-10-26: 10 mg via ORAL
  Filled 2012-10-25: qty 1

## 2012-10-25 MED ORDER — ONDANSETRON HCL 4 MG/2ML IJ SOLN: 4.0000 mg | Freq: Four times a day (QID) | INTRAMUSCULAR | Status: DC | PRN

## 2012-10-25 MED ORDER — FOLIC ACID 1 MG PO TABS
1.0000 mg | ORAL_TABLET | Freq: Every day | ORAL | Status: DC
Start: 2012-10-25 — End: 2012-10-26
  Administered 2012-10-26: 1 mg via ORAL
  Filled 2012-10-25: qty 1

## 2012-10-25 MED ORDER — ONDANSETRON HCL 4 MG/2ML IJ SOLN
4.0000 mg | Freq: Once | INTRAMUSCULAR | Status: AC
  Administered 2012-10-25: 4 mg via INTRAVENOUS
  Filled 2012-10-25: qty 2

## 2012-10-25 MED ORDER — HYDROMORPHONE HCL PF 1 MG/ML IJ SOLN
1.0000 mg | INTRAMUSCULAR | Status: AC | PRN
  Administered 2012-10-25 – 2012-10-26 (×3): 1 mg via INTRAVENOUS
  Filled 2012-10-25 (×3): qty 1

## 2012-10-25 MED ORDER — ALBUTEROL SULFATE HFA 108 (90 BASE) MCG/ACT IN AERS
2.0000 | INHALATION_SPRAY | Freq: Four times a day (QID) | RESPIRATORY_TRACT | Status: DC | PRN
  Filled 2012-10-25: qty 6.7

## 2012-10-25 MED ORDER — MORPHINE SULFATE 4 MG/ML IJ SOLN
4.0000 mg | Freq: Once | INTRAMUSCULAR | Status: AC
  Administered 2012-10-25: 4 mg via INTRAVENOUS
  Filled 2012-10-25: qty 1

## 2012-10-25 MED ORDER — OXYCODONE HCL 20 MG/ML PO CONC
5.0000 mg | ORAL | Status: DC | PRN
  Administered 2012-10-26 (×3): 5 mg via ORAL
  Filled 2012-10-25 (×3): qty 1

## 2012-10-25 NOTE — ED Provider Notes (Signed)
CSN: [id number]     Arrival date & time 10/25/12  1113 History   First MD Initiated Contact with Patient 10/25/12 1205     Chief Complaint  Patient presents with  . Flank Pain   (Consider location/radiation/quality/duration/timing/severity/associated sxs/prior Treatment) HPI  Cameron [name] is a 22 y.o. male with PMH significant for  sickle cell disease, hypertension, and asthma presents with throbbing pain of the right chest and back x 1 day. He states this pain feels like a less severe form of his normal pain crises. The pain during these crises typically involve his whole chest and back and are associated with significant dyspnea. The pain today, which is now only in his chest and a 5/10 on the pain scale, began at 2 pm yesterday, worsened last night, and has gradually improved over the morning with full resolution of the back pain. He has taken tramadol and oxycodone this morning with minimal relief of pain. He reports mild dyspnea and mild cough. He vomited once, but he believes it was due to the pain medication. He denies injury, fever, leg pain, and urinary symptoms. The pain is not worsened with deep breaths. He denies smoking, alcohol, and drug use.    Past Medical History  Diagnosis Date  . Sickle cell anemia   . Hypertension   . Asthma    Past Surgical History  Procedure Laterality Date  . Cholecystectomy     No family history on file. History  Substance Use Topics  . Smoking status: Never Smoker   . Smokeless tobacco: Never Used  . Alcohol Use: No    Review of Systems 10 systems reviewed and found to be negative, except as noted in the HPI  Allergies  Review of patient's allergies indicates no known allergies.  Home Medications   Current Outpatient Rx  Name  Route  Sig  Dispense  Refill  . albuterol (PROVENTIL HFA;VENTOLIN HFA) 108 (90 BASE) MCG/ACT inhaler   Inhalation   Inhale 2 puffs into the lungs every 6 (six) hours as needed. For shortness of breath.          . folic acid (FOLVITE) 1 MG tablet   Oral   Take 1 mg by mouth daily.         . hydroxyurea (HYDREA) 500 MG capsule   Oral   Take 500 mg by mouth daily. May take with food to minimize GI side effects.         Marland Kitchen lisinopril (PRINIVIL,ZESTRIL) 10 MG tablet   Oral   Take 10 mg by mouth daily.           Marland Kitchen oxyCODONE (ROXICODONE) 5 MG immediate release tablet   Oral   Take 5 mg by mouth every 4 (four) hours as needed for pain.         . Pediatric Multiple Vit-C-FA (FLINSTONES GUMMIES OMEGA-3 DHA) CHEW   Oral   Chew 1 tablet by mouth daily.         . traMADol (ULTRAM) 50 MG tablet   Oral   Take 50 mg by mouth every 6 (six) hours as needed for pain.          BP 134/67  Pulse 93  Temp(Src) 98.7 F (37.1 C) (Oral)  Resp 16  SpO2 95% Physical Exam  Nursing note and vitals reviewed. Constitutional: He is oriented to person, place, and time. He appears well-developed and well-nourished. No distress.  HENT:  Head: Normocephalic and atraumatic.  Mouth/Throat: Oropharynx is clear and  moist.  Eyes: Conjunctivae and EOM are normal.  Cardiovascular: Normal rate, regular rhythm and intact distal pulses.   Pulmonary/Chest: Effort normal and breath sounds normal. No stridor. No respiratory distress. He has no wheezes. He has no rales. He exhibits tenderness.  Abdominal: Soft. Bowel sounds are normal. He exhibits no distension and no mass. There is no rebound and no guarding.  Musculoskeletal: Normal range of motion.  Neurological: He is alert and oriented to person, place, and time.  Psychiatric: He has a normal mood and affect.    ED Course  Procedures (including critical care time) Labs Review Labs Reviewed  CBC WITH DIFFERENTIAL - Abnormal; Notable for the following:    WBC 16.4 (*)    RBC 2.74 (*)    Hemoglobin 8.8 (*)    HCT 24.6 (*)    RDW 26.7 (*)    Platelets 665 (*)    Neutrophils Relative % 82 (*)    Lymphocytes Relative 9 (*)    Neutro Abs 13.4 (*)     Monocytes Absolute 1.3 (*)    All other components within normal limits  URINALYSIS, ROUTINE W REFLEX MICROSCOPIC - Abnormal; Notable for the following:    Color, Urine AMBER (*)    Leukocytes, UA TRACE (*)    All other components within normal limits  BASIC METABOLIC PANEL - Abnormal; Notable for the following:    Sodium 131 (*)    Chloride 93 (*)    All other components within normal limits  HEPATIC FUNCTION PANEL - Abnormal; Notable for the following:    AST 50 (*)    Total Bilirubin 8.5 (*)    Indirect Bilirubin 8.2 (*)    All other components within normal limits  RETICULOCYTES - Abnormal; Notable for the following:    Retic Ct Pct 20.9 (*)    RBC. 2.69 (*)    Retic Count, Manual 562.2 (*)    All other components within normal limits  POCT I-STAT, CHEM 8 - Abnormal; Notable for the following:    Creatinine, Ser 5.10 (*)    Glucose, Bld 116 (*)    Hemoglobin 9.9 (*)    HCT 29.0 (*)    All other components within normal limits  URINE MICROSCOPIC-ADD ON   Imaging Review Dg Chest 2 View  10/25/2012   CLINICAL DATA:  Right-sided chest pain. History of asthma. Nonsmoker.  EXAM: CHEST  2 VIEW  COMPARISON:  07/1912  FINDINGS: There are coarse lung markings bilaterally. There has been improvement in aeration of the lung bases since prior study. No new consolidations or pleural effusions identified. No pulmonary edema. Visualized osseous structures have a normal appearance. Surgical clips are identified in the right upper quadrant of the abdomen. Right PICC line has been removed.  IMPRESSION: 1. Improved aeration of the lung bases. 2. Persistent coarse lung markings, consistent with bronchitic changes and or scarring. 3. No new consolidations. 4. No pneumothorax.   Electronically Signed   By: Rosalie Gums M.D.   On: 10/25/2012 13:56    MDM   1. Sickle cell pain crisis    Filed Vitals:   10/25/12 1131 10/25/12 1534  BP: 134/67 136/58  Pulse: 93 95  Temp: 98.7 F (37.1 C) 98.6 F  (37 C)  TempSrc: Oral Oral  Resp: 16 18  SpO2: 95% 94%     Cameron [name] is a 22 y.o. male with right-sided chest pain typical of prior sickle cell pain crises. Patient is afebrile, he does not have symptoms  consistent with pneumonia. My index of suspicion for acute chest is low. Patient has a significant leukocytosis of 16.4. His H&H is 8.8 over 24.6, not significantly more anemic than her baseline. Note that the creatinine obtained in the triage initiated i-STAT chem 8 is inaccurate at 5.0 the events shows a creatinine of 0.52. Patient is very high reticulocyte count of 20.9 and his total bilirubin is very elevated at 8.5. Patient's pain is well controlled with IV morphine, considering he is actively sickling I discussed with the attending and we feel that observation admission is warranted. Especially because he does not have outpatient care.  Patient will be admitted to a MedSurg bed under [facility] hospitalist Dr. Blake Divine  Medications  morphine 4 MG/ML injection 4 mg (4 mg Intravenous Given 10/25/12 1305)  ondansetron (ZOFRAN) injection 4 mg (4 mg Intravenous Given 10/25/12 1305)  sodium chloride 0.9 % bolus 1,000 mL (1,000 mLs Intravenous New Bag/Given 10/25/12 1437)  morphine 4 MG/ML injection 4 mg (4 mg Intravenous Given 10/25/12 1437)   Note: Portions of this report may have been transcribed using voice recognition software. Every effort was made to ensure accuracy; however, inadvertent computerized transcription errors may be present   Wynetta Emery, PA-C 10/25/12 1554

## 2012-10-25 NOTE — ED Notes (Signed)
Pt c/o rt upper side/chest/back pain x1day, vomit x1 this morning, denies urinary difficulty

## 2012-10-25 NOTE — H&P (Signed)
[facility] History and Physical  Cameron [name] ZOX:[id number] DOB: 1990-09-15 DOA: 10/25/2012  Referring physician: EDP PCP: No PCP Per Patient  Specialists: NONE  Chief Complaint: right sided chest pain  HPI: Cameron [name] is a 22 y.o. male with sickle cell anemia, hypertension, asthma, came in for right sided chest pain for 1 day not associated with sob, fever or palpitations, or cough. It is non radiating and intermittent. His labs revealed elevated bilirubin and leukocytosis and retic of 20%. He was referred to medical service for admission for sickle cell pain crisis.   Review of Systems: The patient denies anorexia, fever, weight loss,, vision loss, decreased hearing, hoarseness, syncope, dyspnea on exertion, peripheral edema, balance deficits, hemoptysis, abdominal pain, melena, hematochezia, severe indigestion/heartburn, hematuria, incontinence, genital sores, muscle weakness, suspicious skin lesions, transient blindness, difficulty walking, depression, unusual weight change, abnormal bleeding, enlarged lymph nodes, angioedema, and breast masses.    Past Medical History  Diagnosis Date  . Sickle cell anemia   . Hypertension   . Asthma    Past Surgical History  Procedure Laterality Date  . Cholecystectomy     Social History:  reports that he has never smoked. He has never used smokeless tobacco. He reports that he does not drink alcohol or use illicit drugs.  where does patient live--home,  No Known Allergies    Prior to Admission medications   Medication Sig Start Date End Date Taking? Authorizing Provider  albuterol (PROVENTIL HFA;VENTOLIN HFA) 108 (90 BASE) MCG/ACT inhaler Inhale 2 puffs into the lungs every 6 (six) hours as needed. For shortness of breath.   Yes Historical Provider, MD  folic acid (FOLVITE) 1 MG tablet Take 1 mg by mouth daily.   Yes Historical Provider, MD  hydroxyurea (HYDREA) 500 MG capsule Take 500 mg by mouth daily. May take with food  to minimize GI side effects.   Yes Historical Provider, MD  lisinopril (PRINIVIL,ZESTRIL) 10 MG tablet Take 10 mg by mouth daily.     Yes Historical Provider, MD  Pediatric Multiple Vit-C-FA (FLINSTONES GUMMIES OMEGA-3 DHA) CHEW Chew 1 tablet by mouth daily.   Yes Historical Provider, MD  oxyCODONE (ROXICODONE) 5 MG immediate release tablet Take 3 tablets (15 mg total) by mouth every 4 (four) hours as needed for pain. Take 1-2 tablets every 4-6 hours as needed for pain control 10/25/12   Wynetta Emery, PA-C   Physical Exam: Filed Vitals:   10/25/12 1534  BP: 136/58  Pulse: 95  Temp: 98.6 F (37 C)  Resp: 18   Constitutional: Vital signs reviewed.  Patient is a well-developed and well-nourished  in no acute distress and cooperative with exam. Alert and oriented x3.  Head: Normocephalic and atraumatic Mouth: no erythema or exudates, MMM Eyes: PERRL, EOMI, conjunctivae normal, scleral icterus present.  Neck: Supple, Trachea midline normal ROM, No JVD, mass, thyromegaly, or carotid bruit present.  Cardiovascular: RRR, S1 normal, S2 normal, no MRG, pulses symmetric and intact bilaterally Pulmonary/Chest: normal respiratory effort, CTAB, no wheezes, rales, or rhonchi Abdominal: Soft. Non-tender, non-distended, bowel sounds are normal, no masses, organomegaly, or guarding present.  Musculoskeletal: No joint deformities, erythema, or stiffness, ROM full and no nontender Neurological: A&O x3, Strength is normal and symmetric bilaterally, cranial nerve II-XII are grossly intact, no focal motor deficit, sensory intact to light touch bilaterally.  Skin: Warm, dry and intact. No rash, cyanosis, or clubbing.  Psychiatric: Normal mood and affect. speech and behavior is normal.   Labs on Admission:  Basic Metabolic Panel:  Recent Labs Lab 10/25/12 1300 10/25/12 1306  NA 131* 136  K 4.1 4.2  CL 93* 99  CO2 25  --   GLUCOSE 96 116*  BUN 8 7  CREATININE 0.52 5.10*  CALCIUM 9.4  --    Liver  Function Tests:  Recent Labs Lab 10/25/12 1300  AST 50*  ALT 25  ALKPHOS 69  BILITOT 8.5*  PROT 8.2  ALBUMIN 4.6   No results found for this basename: LIPASE, AMYLASE,  in the last 168 hours No results found for this basename: AMMONIA,  in the last 168 hours CBC:  Recent Labs Lab 10/25/12 1306 10/25/12 1330  WBC  --  16.4*  NEUTROABS  --  13.4*  HGB 9.9* 8.8*  HCT 29.0* 24.6*  MCV  --  89.8  PLT  --  665*   Cardiac Enzymes: No results found for this basename: CKTOTAL, CKMB, CKMBINDEX, TROPONINI,  in the last 168 hours  BNP (last 3 results)  Recent Labs  07/18/12 0901  PROBNP 327.0*   CBG: No results found for this basename: GLUCAP,  in the last 168 hours  Radiological Exams on Admission: Dg Chest 2 View  10/25/2012   CLINICAL DATA:  Right-sided chest pain. History of asthma. Nonsmoker.  EXAM: CHEST  2 VIEW  COMPARISON:  07/1912  FINDINGS: There are coarse lung markings bilaterally. There has been improvement in aeration of the lung bases since prior study. No new consolidations or pleural effusions identified. No pulmonary edema. Visualized osseous structures have a normal appearance. Surgical clips are identified in the right upper quadrant of the abdomen. Right PICC line has been removed.  IMPRESSION: 1. Improved aeration of the lung bases. 2. Persistent coarse lung markings, consistent with bronchitic changes and or scarring. 3. No new consolidations. 4. No pneumothorax.   Electronically Signed   By: Rosalie Gums M.D.   On: 10/25/2012 13:56    EKG: pending.   Assessment/Plan Active Problems: 1. Sickle cell pain crisis:  - admit to observation.  - start IV fluids with 1/2 normal saline at 125 ml/hr  - resume home pain meds in addition to toradol and dilaudid.  - stool softners.   2. Hypertension: controlled.   3. Asthma: no wheezing heard.   DVT prophylaxis  Code Status: presumed full code Family Communication:none Disposition Plan: pending  Time  spent:  [facility] Pager [phone]  If 7PM-7AM, please contact night-coverage [email or web address] Password [facility] 10/25/2012, 6:01 PM

## 2012-10-26 DIAGNOSIS — M549 Dorsalgia, unspecified: Secondary | ICD-10-CM

## 2012-10-26 DIAGNOSIS — L97409 Non-pressure chronic ulcer of unspecified heel and midfoot with unspecified severity: Secondary | ICD-10-CM

## 2012-10-26 LAB — CBC WITH DIFFERENTIAL/PLATELET
Basophils Absolute: 0.1 10*3/uL (ref 0.0–0.1)
Eosinophils Absolute: 0.3 10*3/uL (ref 0.0–0.7)
Eosinophils Relative: 3 % (ref 0–5)
HCT: 21 % — ABNORMAL LOW (ref 39.0–52.0)
Lymphs Abs: 3 10*3/uL (ref 0.7–4.0)
MCH: 31.8 pg (ref 26.0–34.0)
MCHC: 35.2 g/dL (ref 30.0–36.0)
MCV: 90.1 fL (ref 78.0–100.0)
Monocytes Absolute: 1 10*3/uL (ref 0.1–1.0)
Neutro Abs: 7.1 10*3/uL (ref 1.7–7.7)
Neutrophils Relative %: 61 % (ref 43–77)
Platelets: 563 10*3/uL — ABNORMAL HIGH (ref 150–400)
RBC: 2.33 MIL/uL — ABNORMAL LOW (ref 4.22–5.81)
RDW: 25.6 % — ABNORMAL HIGH (ref 11.5–15.5)

## 2012-10-26 LAB — COMPREHENSIVE METABOLIC PANEL
AST: 32 U/L (ref 0–37)
BUN: 9 mg/dL (ref 6–23)
CO2: 28 mEq/L (ref 19–32)
Calcium: 9 mg/dL (ref 8.4–10.5)
Chloride: 99 mEq/L (ref 96–112)
Creatinine, Ser: 0.52 mg/dL (ref 0.50–1.35)
GFR calc Af Amer: >90 mL/min (ref >90)
GFR calc non Af Amer: >90 mL/min (ref >90)
Total Bilirubin: 7.9 mg/dL — ABNORMAL HIGH (ref 0.3–1.2)
Total Protein: 6.9 g/dL (ref 6.0–8.3)

## 2012-10-26 LAB — PREPARE RBC (CROSSMATCH)

## 2012-10-26 MED ORDER — KETOROLAC TROMETHAMINE 30 MG/ML IJ SOLN: 15.0000 mg | Freq: Four times a day (QID) | INTRAMUSCULAR | Status: DC | PRN

## 2012-10-26 MED ORDER — OXYCODONE-ACETAMINOPHEN 10-325 MG PO TABS: 1.0000 | ORAL_TABLET | ORAL | Status: AC | PRN

## 2012-10-26 NOTE — Discharge Summary (Addendum)
Physician Discharge Summary  Cameron [name] WUJ:[id number] DOB: 13-Dec-1990 DOA: 10/25/2012  PCP: No PCP Per Patient  Admit date: 10/25/2012 Discharge date: 10/26/2012  Recommendations for Outpatient Follow-up:   1. Please follow up in sickle cell clinic in 1-2 weeks after discharge to make sure your symptoms are controlled  2. You have received 1 unit PRBC on this admission. Your hemoglobin prior to transfusion was 7.4.  Discharge Diagnoses:  Active Problems:   * No active hospital problems. *    Discharge Condition: medically stable for discahrge home  Diet recommendation: as tolerated  History of present illness:   22 year old male with past medical history of sickle cell disease who presented with generalized pain especially in joints. Pt also reported chest pain for 1 day with no associated fevers or chills. No cough. Pain is not reporducible but was relieved with pain meds given in ED.  Assessment and Plan:  Principal Problem: Chest pain - resolved in ED - 12 lead EKG showed NSR - noted D Dimer was obtained with slight elevation but since no chest pain any more and no palpitation and good oxygen saturation this DD is likely elevated due to sickle cell crisis (elevated retic ct and WBC count on admission)  Active Problems: Acute sickle cell crisis - pain now controlled - prescription provided for percocet PO PRN, continue hydrea and folic acid Hypertension - continue lisinopril; creatinine is WNL  Signed:  Manson Passey, MD  [facility] 10/26/2012, 11:27 AM  Pager #: [phone]   Discharge Exam: Filed Vitals:   10/26/12 1000  BP: 123/57  Pulse: 78  Temp: 98.2 F (36.8 C)  Resp: 16   Filed Vitals:   10/25/12 2040 10/26/12 0229 10/26/12 0600 10/26/12 1000  BP: 109/56 109/72 133/61 123/57  Pulse: 67 77 89 78  Temp: 98.6 F (37 C) 99 F (37.2 C) 99.4 F (37.4 C) 98.2 F (36.8 C)  TempSrc: Oral Oral Oral Oral  Resp: 18 16 16 16   Height:       Weight:   59.324 kg (130 lb 12.6 oz)   SpO2: 100% 94%  97%    General: Pt is alert, follows commands appropriately, not in acute distress Cardiovascular: Regular rate and rhythm, S1/S2 +, no murmurs, no rubs, no gallops Respiratory: Clear to auscultation bilaterally, no wheezing, no crackles, no rhonchi Abdominal: Soft, non tender, non distended, bowel sounds +, no guarding Extremities: no edema, no cyanosis, pulses palpable bilaterally DP and PT Neuro: Grossly nonfocal  Discharge Instructions  Discharge Orders   Future Orders Complete By Expires   Call MD for:  difficulty breathing, headache or visual disturbances  As directed    Call MD for:  persistant dizziness or light-headedness  As directed    Call MD for:  persistant nausea and vomiting  As directed    Call MD for:  redness, tenderness, or signs of infection (pain, swelling, redness, odor or Polio/yellow discharge around incision site)  As directed    Diet - low sodium heart healthy  As directed    Discharge instructions  As directed    Comments:     1. Please follow up in sickle cell clinic in 1-2 weeks after discharge to make sure your symptoms are controlled 2. You have received 1 unit PRBC on this admission. Your hemoglobin prior to transfusion was 7.4.   Increase activity slowly  As directed        Medication List    STOP taking these medications  traMADol 50 MG tablet  Commonly known as:  ULTRAM      TAKE these medications       albuterol 108 (90 BASE) MCG/ACT inhaler  Commonly known as:  PROVENTIL HFA;VENTOLIN HFA  Inhale 2 puffs into the lungs every 6 (six) hours as needed. For shortness of breath.     FLINSTONES GUMMIES OMEGA-3 DHA Chew  Chew 1 tablet by mouth daily.     folic acid 1 MG tablet  Commonly known as:  FOLVITE  Take 1 mg by mouth daily.     hydroxyurea 500 MG capsule  Commonly known as:  HYDREA  Take 500 mg by mouth daily. May take with food to minimize GI side effects.      lisinopril 10 MG tablet  Commonly known as:  PRINIVIL,ZESTRIL  Take 10 mg by mouth daily.     oxyCODONE 5 MG immediate release tablet  Commonly known as:  ROXICODONE  Take 3 tablets (15 mg total) by mouth every 4 (four) hours as needed for pain. Take 1-2 tablets every 4-6 hours as needed for pain control     oxyCODONE-acetaminophen 10-325 MG per tablet  Commonly known as:  PERCOCET  Take 1 tablet by mouth every 4 (four) hours as needed for pain.           Follow-up Information   Follow up with [facility].   Specialty:  Internal Medicine   Contact information:   [address] [place] [address] [phone]      Follow up with [facility] (If symptoms worsen)    Specialty:  Emergency Medicine   Contact information:   [address] [place] [id number] [place] [address] [phone]       The results of significant diagnostics from this hospitalization (including imaging, microbiology, ancillary and laboratory) are listed below for reference.    Significant Diagnostic Studies: Dg Chest 2 View 10/25/2012    IMPRESSION: 1. Improved aeration of the lung bases. 2. Persistent coarse lung markings, consistent with bronchitic changes and or scarring. 3. No new consolidations. 4. No pneumothorax.   Electronically Signed   By: Rosalie Gums M.D.   On: 10/25/2012 13:56    Microbiology: No results found for this or any previous visit (from the past 240 hour(s)).   Labs: Basic Metabolic Panel:  Recent Labs Lab 10/25/12 1300 10/25/12 1306 10/25/12 2103 10/26/12 0344  NA 131* 136 135 134*  K 4.1 4.2 3.6 3.9  CL 93* 99 98 99  CO2 25  --  28 28  GLUCOSE 96 116* 106* 101*  BUN 8 7 9 9   CREATININE 0.52 5.10* 0.61 0.52  CALCIUM 9.4  --  8.8 9.0   Liver Function Tests:  Recent Labs Lab 10/25/12 1300 10/26/12 0344  AST 50* 32  ALT 25 22  ALKPHOS 69 60  BILITOT 8.5* 7.9*  PROT 8.2 6.9  ALBUMIN 4.6 3.7    No results found for this basename: LIPASE, AMYLASE,  in the last 168 hours No results found for this basename: AMMONIA,  in the last 168 hours CBC:  Recent Labs Lab 10/25/12 1306 10/25/12 1330 10/26/12 0344  WBC  --  16.4* 11.5*  NEUTROABS  --  13.4* 7.1  HGB 9.9* 8.8* 7.4*  HCT 29.0* 24.6* 21.0*  MCV  --  89.8 90.1  PLT  --  665* 563*   Cardiac Enzymes: No results found for this basename: CKTOTAL, CKMB, CKMBINDEX, TROPONINI,  in the last 168 hours BNP: BNP (last 3 results)  Recent Labs  07/18/12 0901  PROBNP 327.0*   CBG: No results found for this basename: GLUCAP,  in the last 168 hours  Time coordinating discharge: Over 30 minutes

## 2012-10-26 NOTE — ED Provider Notes (Signed)
Medical screening examination/treatment/procedure(s) were performed by non-physician practitioner and as supervising physician I was immediately available for consultation/collaboration.  Castor Gittleman T Joseeduardo Brix, MD 10/26/12 0708 

## 2012-10-26 NOTE — Care Management (Signed)
This CM was notified by CM Sharmon Leyden who advised Mr. Latin had completed his new patient packet. This CM explained the setup of the [facility] being a PCP and day hospital availalble Monday-Friday 8am-8pm. This CM advised Mr. Mochizuki that we will request his medical records, upon receipt we will review and call to schedule new pt appt at that time. Mr. Swander verbalized understanding and agreed.   Karoline Caldwell, RN, BSN, [place]    [phone]  Time spent: 15 minutes

## 2012-10-26 NOTE — Care Management Note (Signed)
Cm spoke with patient concerning no PCP on record. Pt states previously received a new patient packet from Cm at [facility]. Pt completed packet present at bedside. This CM contacted CM Karoline Caldwell at [facility] to assist patient in arranging follow up PCP appointment with Dr.Michelle Ashley Royalty upon discharge to establish care. No other barriers identified at this time.    Roxy Manns Sol Odor,RN,MSN [phone]

## 2012-10-27 LAB — TYPE AND SCREEN
ABO/RH(D): B POS
Antibody Screen: NEGATIVE
Unit division: 0

## 2012-11-04 ENCOUNTER — Telehealth: Payer: Self-pay

## 2012-11-04 NOTE — Telephone Encounter (Signed)
This CM placed call to Mr. Popp to schedule an appt to establish care with Dr. Ashley Royalty. Mr. Walck mobile phone does not have a voicemail setup. This CM spoke with his mother who stated she will text him my call back information. This CM awaiting a call back to schedule appt.   Karoline Caldwell, RN, BSN, [place]    [phone]

## 2012-11-17 ENCOUNTER — Ambulatory Visit: Payer: BC Managed Care – PPO | Admitting: Internal Medicine

## 2012-11-17 ENCOUNTER — Encounter: Payer: Self-pay | Admitting: Internal Medicine

## 2012-11-17 VITALS — BP 124/59 | HR 73 | Temp 98.1°F | Resp 14 | Ht 65.5 in | Wt 132.0 lb

## 2012-11-17 DIAGNOSIS — I10 Essential (primary) hypertension: Secondary | ICD-10-CM

## 2012-11-17 DIAGNOSIS — J45901 Unspecified asthma with (acute) exacerbation: Secondary | ICD-10-CM

## 2012-11-17 DIAGNOSIS — D571 Sickle-cell disease without crisis: Secondary | ICD-10-CM

## 2012-11-17 DIAGNOSIS — Z23 Encounter for immunization: Secondary | ICD-10-CM

## 2012-11-17 DIAGNOSIS — J45909 Unspecified asthma, uncomplicated: Secondary | ICD-10-CM | POA: Insufficient documentation

## 2012-11-17 MED ORDER — ALBUTEROL SULFATE HFA 108 (90 BASE) MCG/ACT IN AERS: 2.0000 | INHALATION_SPRAY | Freq: Four times a day (QID) | RESPIRATORY_TRACT | Status: AC | PRN

## 2012-11-17 NOTE — Progress Notes (Unsigned)
  Subjective:    Patient ID: Cameron [name], male    DOB: November 14, 1990, 22 y.o.   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 He is managed for his Sickle Cell by Dr.Nirmish Sherryll Burger at [facility]. Pt states that he is doing well and has not had any significant crises. He states that he expected to be discharged home from the ED on his last ED presentation but was admitted overnight for observation.   He has a Stage manager in [place] and Hematologist at [place]. 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  Pt was placed on Xarelto in [place] for an UE DVT. He states that he took it for about 3 weeks but could not afford the prescription so did not refill it.   He does have a H/O asthma but has had no problems. He states that he does need a rescue inhaler as his last prescription is completed.    Review of Systems  Constitutional: Negative.   HENT: Negative.   Eyes: Negative.   Respiratory: Negative.   Cardiovascular: Negative.   Gastrointestinal: Negative.   Endocrine: Negative.   Genitourinary: Negative.   Skin: Negative.   Allergic/Immunologic: Negative.   Neurological: Negative.   Hematological: Negative.   Psychiatric/Behavioral: Negative.        Objective:   Physical Exam  Constitutional: He is oriented to person, place, and time. He appears well-developed and well-nourished.  HENT:  Head: Atraumatic.  Eyes: Conjunctivae and EOM are normal. Pupils are equal, round, and reactive to light. No scleral icterus.  Neck: Normal range of motion. Neck supple.  Cardiovascular: Normal rate and regular rhythm.  Exam reveals no gallop and no friction rub.   No murmur heard. Pulmonary/Chest: Effort normal and breath sounds normal. He has no wheezes. He has no rales. He exhibits no tenderness.  Abdominal: Soft. Bowel sounds are normal. He exhibits no mass.  Musculoskeletal: Normal  range of motion.  Pt has no masses, or edema at the area of the RUE where he had a previous DVT.  Neurological: He is alert and oriented to person, place, and time.  Skin: Skin is warm and dry.  Psychiatric: He has a normal mood and affect. His behavior is normal. Judgment and thought content normal.          Assessment & Plan:  1. Hb SS: Genotype verified from [facility] records on [facility]where. He has his care provided by Dr. Sherryll Burger at [facility] and Dr. Sherryll Burger also prescribes his pain medication.  He had his vision examination a few months ago and states that he had no problems. He is presently on Hydrea 1000 mg daily and had been on doses as high as 1500 mg daily but this was discontinued secondary to a leg ulcer.  2. Asthma: Pt has a history of Asthma. He has only required rescue medication in the form of Albuterol. Will refill his prescription for Albuterol today.  3. Immunization: Pt has had a tDAP last year and influenza vaccine this year.   4. H/O DVT: Pt stopped Xarelto after 3 weeks of therapy. I have exmined   5. HTN: BP well controlled on Lisinopril  Labs from last hospitalization reviewed. No labs needed today.   RTC: PRN

## 2012-12-21 ENCOUNTER — Encounter: Payer: Self-pay | Admitting: Internal Medicine

## 2013-12-19 ENCOUNTER — Inpatient Hospital Stay (HOSPITAL_COMMUNITY)
Admission: EM | Admit: 2013-12-19 | Discharge: 2013-12-24 | DRG: 193 | Disposition: A | Discharge status: Home / Self Care | Payer: BC Managed Care – PPO | Attending: Internal Medicine | Admitting: Internal Medicine

## 2013-12-19 ENCOUNTER — Emergency Department (HOSPITAL_COMMUNITY): Payer: BC Managed Care – PPO

## 2013-12-19 ENCOUNTER — Encounter (HOSPITAL_COMMUNITY): Payer: Self-pay | Admitting: Emergency Medicine

## 2013-12-19 ENCOUNTER — Encounter (HOSPITAL_COMMUNITY): Payer: Self-pay | Admitting: *Deleted

## 2013-12-19 ENCOUNTER — Emergency Department (HOSPITAL_COMMUNITY)
Admission: EM | Admit: 2013-12-19 | Discharge: 2013-12-19 | Disposition: A | Discharge status: Home / Self Care | Payer: BC Managed Care – PPO | Attending: Emergency Medicine | Admitting: Emergency Medicine

## 2013-12-19 DIAGNOSIS — Z9119 Patient's noncompliance with other medical treatment and regimen: Secondary | ICD-10-CM | POA: Diagnosis present

## 2013-12-19 DIAGNOSIS — R011 Cardiac murmur, unspecified: Secondary | ICD-10-CM | POA: Diagnosis not present

## 2013-12-19 DIAGNOSIS — I10 Essential (primary) hypertension: Secondary | ICD-10-CM | POA: Insufficient documentation

## 2013-12-19 DIAGNOSIS — J4520 Mild intermittent asthma, uncomplicated: Secondary | ICD-10-CM

## 2013-12-19 DIAGNOSIS — J45909 Unspecified asthma, uncomplicated: Secondary | ICD-10-CM | POA: Insufficient documentation

## 2013-12-19 DIAGNOSIS — J189 Pneumonia, unspecified organism: Secondary | ICD-10-CM | POA: Diagnosis present

## 2013-12-19 DIAGNOSIS — Z9049 Acquired absence of other specified parts of digestive tract: Secondary | ICD-10-CM | POA: Diagnosis present

## 2013-12-19 DIAGNOSIS — R0902 Hypoxemia: Secondary | ICD-10-CM | POA: Diagnosis present

## 2013-12-19 DIAGNOSIS — D5700 Hb-SS disease with crisis, unspecified: Secondary | ICD-10-CM | POA: Diagnosis present

## 2013-12-19 DIAGNOSIS — D5701 Hb-SS disease with acute chest syndrome: Secondary | ICD-10-CM | POA: Diagnosis present

## 2013-12-19 DIAGNOSIS — R509 Fever, unspecified: Secondary | ICD-10-CM | POA: Diagnosis present

## 2013-12-19 DIAGNOSIS — J99 Respiratory disorders in diseases classified elsewhere: Secondary | ICD-10-CM | POA: Diagnosis present

## 2013-12-19 DIAGNOSIS — Z79899 Other long term (current) drug therapy: Secondary | ICD-10-CM | POA: Insufficient documentation

## 2013-12-19 DIAGNOSIS — R079 Chest pain, unspecified: Secondary | ICD-10-CM | POA: Diagnosis present

## 2013-12-19 DIAGNOSIS — R000 Tachycardia, unspecified: Secondary | ICD-10-CM | POA: Diagnosis present

## 2013-12-19 LAB — URINALYSIS, ROUTINE W REFLEX MICROSCOPIC
Bilirubin Urine: NEGATIVE
Glucose, UA: NEGATIVE mg/dL
KETONES UR: NEGATIVE mg/dL
LEUKOCYTES UA: NEGATIVE
NITRITE: NEGATIVE
PROTEIN: NEGATIVE mg/dL
Specific Gravity, Urine: 1.012 (ref 1.005–1.030)
UROBILINOGEN UA: 1 mg/dL (ref 0.0–1.0)
pH: 5.5 (ref 5.0–8.0)

## 2013-12-19 LAB — CBC WITH DIFFERENTIAL/PLATELET
Basophils Absolute: 0.2 10*3/uL — ABNORMAL HIGH (ref 0.0–0.1)
Basophils Absolute: 0 10*3/uL (ref 0.0–0.1)
Basophils Relative: 1 % (ref 0–1)
Basophils Relative: 0 % (ref 0–1)
Eosinophils Absolute: 0.5 10*3/uL (ref 0.0–0.7)
Eosinophils Absolute: 0 10*3/uL (ref 0.0–0.7)
Eosinophils Relative: 3 % (ref 0–5)
Eosinophils Relative: 0 % (ref 0–5)
HCT: 24.6 % — ABNORMAL LOW (ref 39.0–52.0)
HEMATOCRIT: 25.3 % — AB (ref 39.0–52.0)
Hemoglobin: 9.2 g/dL — ABNORMAL LOW (ref 13.0–17.0)
Hemoglobin: 8.8 g/dL — ABNORMAL LOW (ref 13.0–17.0)
LYMPHS ABS: 5.3 10*3/uL — AB (ref 0.7–4.0)
LYMPHS ABS: 2.8 10*3/uL (ref 0.7–4.0)
Lymphocytes Relative: 32 % (ref 12–46)
Lymphocytes Relative: 15 % (ref 12–46)
MCH: 34.5 pg — ABNORMAL HIGH (ref 26.0–34.0)
MCH: 34.4 pg — AB (ref 26.0–34.0)
MCHC: 36.4 g/dL — ABNORMAL HIGH (ref 30.0–36.0)
MCHC: 35.8 g/dL (ref 30.0–36.0)
MCV: 94.8 fL (ref 78.0–100.0)
MCV: 96.1 fL (ref 78.0–100.0)
MONO ABS: 2.1 10*3/uL — AB (ref 0.1–1.0)
MONOS PCT: 12 % (ref 3–12)
MONOS PCT: 11 % (ref 3–12)
Monocytes Absolute: 2 10*3/uL — ABNORMAL HIGH (ref 0.1–1.0)
NEUTROS ABS: 8.6 10*3/uL — AB (ref 1.7–7.7)
Neutro Abs: 13.9 10*3/uL — ABNORMAL HIGH (ref 1.7–7.7)
Neutrophils Relative %: 52 % (ref 43–77)
Neutrophils Relative %: 74 % (ref 43–77)
PLATELETS: 274 10*3/uL (ref 150–400)
Platelets: 382 10*3/uL (ref 150–400)
RBC: 2.67 MIL/uL — ABNORMAL LOW (ref 4.22–5.81)
RBC: 2.56 MIL/uL — AB (ref 4.22–5.81)
RDW: 24.6 % — ABNORMAL HIGH (ref 11.5–15.5)
RDW: 24.6 % — AB (ref 11.5–15.5)
WBC: 16.6 10*3/uL — AB (ref 4.0–10.5)
WBC: 18.8 10*3/uL — AB (ref 4.0–10.5)

## 2013-12-19 LAB — COMPREHENSIVE METABOLIC PANEL
ALBUMIN: 4.7 g/dL (ref 3.5–5.2)
ALT: 15 U/L (ref 0–53)
ALT: 29 U/L (ref 0–53)
ANION GAP: 14 (ref 5–15)
AST: 39 U/L — ABNORMAL HIGH (ref 0–37)
AST: 47 U/L — ABNORMAL HIGH (ref 0–37)
Albumin: 4.8 g/dL (ref 3.5–5.2)
Alkaline Phosphatase: 82 U/L (ref 39–117)
Alkaline Phosphatase: 71 U/L (ref 39–117)
Anion gap: 14 (ref 5–15)
BUN: 9 mg/dL (ref 6–23)
BUN: 9 mg/dL (ref 6–23)
CALCIUM: 9.6 mg/dL (ref 8.4–10.5)
CO2: 24 mEq/L (ref 19–32)
CO2: 24 mEq/L (ref 19–32)
CREATININE: 0.59 mg/dL (ref 0.50–1.35)
CREATININE: 0.63 mg/dL (ref 0.50–1.35)
Calcium: 9.7 mg/dL (ref 8.4–10.5)
Chloride: 103 mEq/L (ref 96–112)
Chloride: 98 mEq/L (ref 96–112)
GFR calc Af Amer: >90 mL/min (ref >90)
GFR calc non Af Amer: >90 mL/min (ref >90)
GLUCOSE: 104 mg/dL — AB (ref 70–99)
Glucose, Bld: 115 mg/dL — ABNORMAL HIGH (ref 70–99)
Potassium: 4.6 mEq/L (ref 3.7–5.3)
Potassium: 4.3 mEq/L (ref 3.7–5.3)
Sodium: 141 mEq/L (ref 137–147)
Sodium: 136 mEq/L — ABNORMAL LOW (ref 137–147)
TOTAL PROTEIN: 8.2 g/dL (ref 6.0–8.3)
Total Bilirubin: 6.2 mg/dL — ABNORMAL HIGH (ref 0.3–1.2)
Total Bilirubin: 7.2 mg/dL — ABNORMAL HIGH (ref 0.3–1.2)
Total Protein: 8.5 g/dL — ABNORMAL HIGH (ref 6.0–8.3)

## 2013-12-19 LAB — I-STAT TROPONIN, ED: TROPONIN I, POC: 0.01 ng/mL (ref 0.00–0.08)

## 2013-12-19 LAB — RETICULOCYTES
RBC.: 2.67 MIL/uL — AB (ref 4.22–5.81)
RBC.: 2.56 MIL/uL — AB (ref 4.22–5.81)

## 2013-12-19 LAB — TROPONIN I

## 2013-12-19 LAB — URINE MICROSCOPIC-ADD ON

## 2013-12-19 LAB — LACTATE DEHYDROGENASE: LDH: 675 U/L — ABNORMAL HIGH (ref 94–250)

## 2013-12-19 MED ORDER — HYDROXYUREA 500 MG PO CAPS
1500.0000 mg | ORAL_CAPSULE | Freq: Every day | ORAL | Status: DC
  Administered 2013-12-19 – 2013-12-24 (×6): 1500 mg via ORAL
  Filled 2013-12-19 (×6): qty 3

## 2013-12-19 MED ORDER — HYDROMORPHONE HCL 1 MG/ML IJ SOLN
1.0000 mg | Freq: Once | INTRAMUSCULAR | Status: AC
  Administered 2013-12-19: 1 mg via INTRAVENOUS
  Filled 2013-12-19: qty 1

## 2013-12-19 MED ORDER — POLYETHYLENE GLYCOL 3350 17 G PO PACK
17.0000 g | PACK | Freq: Every day | ORAL | Status: DC | PRN
  Filled 2013-12-19: qty 1

## 2013-12-19 MED ORDER — SODIUM CHLORIDE 0.45 % IV SOLN
INTRAVENOUS | Status: AC
  Administered 2013-12-19: 22:00:00 via INTRAVENOUS

## 2013-12-19 MED ORDER — IOHEXOL 350 MG/ML SOLN
100.0000 mL | Freq: Once | INTRAVENOUS | Status: AC | PRN
  Administered 2013-12-19: 100 mL via INTRAVENOUS

## 2013-12-19 MED ORDER — ACETAMINOPHEN 325 MG PO TABS
650.0000 mg | ORAL_TABLET | Freq: Four times a day (QID) | ORAL | Status: DC | PRN
  Administered 2013-12-19 – 2013-12-23 (×5): 650 mg via ORAL
  Filled 2013-12-19 (×5): qty 2

## 2013-12-19 MED ORDER — INFLUENZA VAC SPLIT QUAD 0.5 ML IM SUSY
0.5000 mL | PREFILLED_SYRINGE | INTRAMUSCULAR | Status: AC
  Administered 2013-12-20: 0.5 mL via INTRAMUSCULAR
  Filled 2013-12-19 (×2): qty 0.5

## 2013-12-19 MED ORDER — FOLIC ACID 1 MG PO TABS
1.0000 mg | ORAL_TABLET | Freq: Every day | ORAL | Status: DC
  Administered 2013-12-20 – 2013-12-24 (×5): 1 mg via ORAL
  Filled 2013-12-19 (×5): qty 1

## 2013-12-19 MED ORDER — FOLIC ACID 1 MG PO TABS: 1.0000 mg | ORAL_TABLET | Freq: Every day | ORAL | Status: DC

## 2013-12-19 MED ORDER — OXYCODONE-ACETAMINOPHEN 5-325 MG PO TABS
2.0000 | ORAL_TABLET | Freq: Once | ORAL | Status: AC
Start: 2013-12-19 — End: 2013-12-19
  Administered 2013-12-19: 2 via ORAL
  Filled 2013-12-19: qty 2

## 2013-12-19 MED ORDER — CHLORHEXIDINE GLUCONATE 0.12 % MT SOLN
15.0000 mL | Freq: Two times a day (BID) | OROMUCOSAL | Status: DC
  Administered 2013-12-20 – 2013-12-24 (×8): 15 mL via OROMUCOSAL
  Filled 2013-12-19 (×12): qty 15

## 2013-12-19 MED ORDER — SENNOSIDES-DOCUSATE SODIUM 8.6-50 MG PO TABS
1.0000 | ORAL_TABLET | Freq: Two times a day (BID) | ORAL | Status: DC
  Administered 2013-12-19 – 2013-12-24 (×10): 1 via ORAL
  Filled 2013-12-19 (×12): qty 1

## 2013-12-19 MED ORDER — SODIUM CHLORIDE 0.9 % IJ SOLN: 9.0000 mL | INTRAMUSCULAR | Status: DC | PRN

## 2013-12-19 MED ORDER — KETOROLAC TROMETHAMINE 15 MG/ML IJ SOLN
15.0000 mg | Freq: Three times a day (TID) | INTRAMUSCULAR | Status: DC | PRN
  Administered 2013-12-20: 15 mg via INTRAVENOUS
  Filled 2013-12-19: qty 1

## 2013-12-19 MED ORDER — HYDROMORPHONE HCL 1 MG/ML IJ SOLN
0.5000 mg | Freq: Once | INTRAMUSCULAR | Status: AC
  Administered 2013-12-19: 0.5 mg via INTRAVENOUS
  Filled 2013-12-19: qty 1

## 2013-12-19 MED ORDER — CETYLPYRIDINIUM CHLORIDE 0.05 % MT LIQD
7.0000 mL | Freq: Two times a day (BID) | OROMUCOSAL | Status: DC
  Administered 2013-12-20 – 2013-12-21 (×2): 7 mL via OROMUCOSAL

## 2013-12-19 MED ORDER — LISINOPRIL 10 MG PO TABS
10.0000 mg | ORAL_TABLET | Freq: Every day | ORAL | Status: DC
  Administered 2013-12-19 – 2013-12-24 (×6): 10 mg via ORAL
  Filled 2013-12-19 (×6): qty 1

## 2013-12-19 MED ORDER — SODIUM CHLORIDE 0.45 % IV BOLUS
500.0000 mL | Freq: Once | INTRAVENOUS | Status: AC
Start: 2013-12-19 — End: 2013-12-20
  Administered 2013-12-19: 500 mL via INTRAVENOUS

## 2013-12-19 MED ORDER — PROMETHAZINE HCL 25 MG/ML IJ SOLN: 25.0000 mg | Freq: Once | INTRAMUSCULAR | Status: DC

## 2013-12-19 MED ORDER — DIPHENHYDRAMINE HCL 50 MG/ML IJ SOLN: 12.5000 mg | Freq: Four times a day (QID) | INTRAMUSCULAR | Status: DC | PRN

## 2013-12-19 MED ORDER — HYDROMORPHONE HCL 1 MG/ML IJ SOLN
0.5000 mg | Freq: Once | INTRAMUSCULAR | Status: AC
Start: 2013-12-19 — End: 2013-12-19
  Administered 2013-12-19: 0.5 mg via INTRAVENOUS
  Filled 2013-12-19: qty 1

## 2013-12-19 MED ORDER — ONDANSETRON HCL 4 MG/2ML IJ SOLN
4.0000 mg | Freq: Four times a day (QID) | INTRAMUSCULAR | Status: DC | PRN
  Administered 2013-12-20: 4 mg via INTRAVENOUS
  Filled 2013-12-19: qty 2

## 2013-12-19 MED ORDER — DIPHENHYDRAMINE HCL 12.5 MG/5ML PO ELIX: 12.5000 mg | ORAL_SOLUTION | Freq: Four times a day (QID) | ORAL | Status: DC | PRN

## 2013-12-19 MED ORDER — ALBUTEROL SULFATE (2.5 MG/3ML) 0.083% IN NEBU: 3.0000 mL | INHALATION_SOLUTION | Freq: Four times a day (QID) | RESPIRATORY_TRACT | Status: DC | PRN

## 2013-12-19 MED ORDER — LEVOFLOXACIN IN D5W 750 MG/150ML IV SOLN
750.0000 mg | INTRAVENOUS | Status: DC
  Administered 2013-12-19 – 2013-12-22 (×4): 750 mg via INTRAVENOUS
  Filled 2013-12-19 (×4): qty 150

## 2013-12-19 MED ORDER — HYDROMORPHONE 0.3 MG/ML IV SOLN
INTRAVENOUS | Status: DC
  Administered 2013-12-19: 0.9 mg via INTRAVENOUS
  Administered 2013-12-19: 22:00:00 via INTRAVENOUS
  Administered 2013-12-20: 2.7 mg via INTRAVENOUS
  Administered 2013-12-20: 08:00:00 via INTRAVENOUS
  Administered 2013-12-20: 2.7 mg via INTRAVENOUS
  Administered 2013-12-20: 1.2 mg via INTRAVENOUS
  Administered 2013-12-21: 0.3 mg via INTRAVENOUS
  Administered 2013-12-21: 1.5 mg via INTRAVENOUS
  Administered 2013-12-21: 0.3 mg via INTRAVENOUS
  Filled 2013-12-19 (×2): qty 25

## 2013-12-19 MED ORDER — ENOXAPARIN SODIUM 40 MG/0.4ML ~~LOC~~ SOLN
40.0000 mg | SUBCUTANEOUS | Status: DC
Start: 2013-12-19 — End: 2013-12-24
  Administered 2013-12-19 – 2013-12-23 (×5): 40 mg via SUBCUTANEOUS
  Filled 2013-12-19 (×6): qty 0.4

## 2013-12-19 MED ORDER — KETOROLAC TROMETHAMINE 30 MG/ML IJ SOLN
30.0000 mg | Freq: Once | INTRAMUSCULAR | Status: AC
  Administered 2013-12-19: 30 mg via INTRAVENOUS
  Filled 2013-12-19: qty 1

## 2013-12-19 MED ORDER — NALOXONE HCL 0.4 MG/ML IJ SOLN: 0.4000 mg | INTRAMUSCULAR | Status: DC | PRN

## 2013-12-19 MED ORDER — SODIUM CHLORIDE 0.9 % IV BOLUS (SEPSIS)
1000.0000 mL | Freq: Once | INTRAVENOUS | Status: AC
  Administered 2013-12-19: 1000 mL via INTRAVENOUS

## 2013-12-19 NOTE — Discharge Instructions (Signed)
FOLLOW UP WITH [facility] FOR RECHECK. RETURN TO THE EMERGENCY DEPARTMENT WITH ANY UNCONTROLLED PAIN, HIGH FEVER OR NEW CONCERN.

## 2013-12-19 NOTE — ED Notes (Signed)
Pt was just seen early this morning for same sx. Pt having pain in back, sickle cell crisis.

## 2013-12-19 NOTE — H&P (Signed)
[facility] History and Physical  Cameron [name] ZOX:[id number] DOB: [birth year] DOA: 12/19/2013  Referring physician: ER physician. PCP: No PCP Per Patient  Chief Complaint: Low back pain and chest pain.  HPI: Cameron [name] is a 23 y.o. male with history of sickle cell disease, hypertension and asthma results to ER because of ongoing low back pain and chest pain over the last 2 days. Patient had originally come to the ER and was discharged home after treating him for pain but had to come by because of worsening pain. Patient also has been experiencing some chest pain bilaterally in the lower part of the chest. Denies any associated cough or shortness of breath. Since patient was found to be tachycardic patient had a CT angiography of the chest which was negative for PE but was showing bilateral infiltrates. Patient has been admitted for further management for sickle cell pain crisis with possible acute chest syndrome. Patient was also found to be febrile. Patient had mild nausea but denies any vomiting or abdominal pain or any dysuria. Patient denies any headache visual symptoms or any focal deficits. Patient states he has not taken his hydroxyurea for last 2 weeks because she had ran out of it.   Review of Systems: As presented in the history of presenting illness, rest negative.  Past Medical History  Diagnosis Date  . Sickle cell anemia   . Hypertension   . Asthma    Past Surgical History  Procedure Laterality Date  . Cholecystectomy     Social History:  reports that he has never smoked. He has never used smokeless tobacco. He reports that he does not drink alcohol or use illicit drugs. Where does patient live home. Can patient participate in ADLs? Yes.  No Known Allergies  Family History: History reviewed. No pertinent family history.    Prior to Admission medications   Medication Sig Start Date End Date Taking? Authorizing Provider  folic acid (FOLVITE) 1 MG tablet Take  1 mg by mouth daily.   Yes Historical Provider, MD  hydroxyurea (HYDREA) 500 MG capsule Take 1,500 mg by mouth daily. May take with food to minimize GI side effects.   Yes Historical Provider, MD  lisinopril (PRINIVIL,ZESTRIL) 10 MG tablet Take 10 mg by mouth daily.     Yes Historical Provider, MD  oxyCODONE (ROXICODONE) 5 MG immediate release tablet Take 3 tablets (15 mg total) by mouth every 4 (four) hours as needed for pain. Take 1-2 tablets every 4-6 hours as needed for pain control Patient taking differently: Take 15 mg by mouth every 4 (four) hours as needed for severe pain. Take 2 tablets every 4 hours as needed for pain control 10/25/12  Yes Nicole Pisciotta, PA-C  albuterol (PROVENTIL HFA;VENTOLIN HFA) 108 (90 BASE) MCG/ACT inhaler Inhale 2 puffs into the lungs every 6 (six) hours as needed. For shortness of breath. 11/17/12   Altha HarmMichelle A Matthews, MD  oxyCODONE-acetaminophen (PERCOCET) 10-325 MG per tablet Take 1 tablet by mouth every 4 (four) hours as needed for pain. 10/26/12   Alison MurrayAlma M Devine, MD  Pediatric Multiple Vit-C-FA (FLINSTONES GUMMIES OMEGA-3 DHA) CHEW Chew 1 tablet by mouth daily.    Historical Provider, MD    Physical Exam: Filed Vitals:   12/19/13 1837 12/19/13 1843 12/19/13 1945 12/19/13 2010  BP:   165/81 169/65  Pulse:   128 117  Temp:    102.1 F (38.9 C)  TempSrc:    Oral  Resp:   20 22  Height:  5\' 5"  (1.651 m)  Weight:    65.2 kg (143 lb 11.8 oz)  SpO2: 88% 93% 98% 100%     General:  Moderately built and nourished.  Eyes: anicteric mild pallor.  ENT: no discharge from the ears eyes nose or mouth.  Neck: no mass felt.  Cardiovascular: S1-S2 heard.  Respiratory: no rhonchi or crepitations.  Abdomen: soft nontender bowel sounds present.  Skin: no rash.  Musculoskeletal: no edema.  Psychiatric: appears normal.  Neurologic: alert awake oriented to time place and person. Moves all extremities.  Labs on Admission:  Basic Metabolic  Panel:  Recent Labs Lab 12/19/13 0316 12/19/13 1524  NA 141 136*  K 4.6 4.3  CL 103 98  CO2 24 24  GLUCOSE 104* 115*  BUN 9 9  CREATININE 0.59 0.63  CALCIUM 9.6 9.7   Liver Function Tests:  Recent Labs Lab 12/19/13 0316 12/19/13 1524  AST 39* 47*  ALT 15 29  ALKPHOS 82 71  BILITOT 6.2* 7.2*  PROT 8.5* 8.2  ALBUMIN 4.8 4.7   No results for input(s): LIPASE, AMYLASE in the last 168 hours. No results for input(s): AMMONIA in the last 168 hours. CBC:  Recent Labs Lab 12/19/13 0316 12/19/13 1524  WBC 16.6* 18.8*  NEUTROABS 8.6* 13.9*  HGB 9.2* 8.8*  HCT 25.3* 24.6*  MCV 94.8 96.1  PLT 382 274   Cardiac Enzymes: No results for input(s): CKTOTAL, CKMB, CKMBINDEX, TROPONINI in the last 168 hours.  BNP (last 3 results) No results for input(s): PROBNP in the last 8760 hours. CBG: No results for input(s): GLUCAP in the last 168 hours.  Radiological Exams on Admission: Dg Chest 2 View  12/19/2013   CLINICAL DATA:  Sickle cell crisis  EXAM: CHEST  2 VIEW  COMPARISON:  12/19/2013 200 hr  FINDINGS: Low lung volumes. Bibasilar atelectasis. Cardiomegaly. Vascular congestion. No evidence of interstitial edema.  IMPRESSION: Increasing bibasilar atelectasis and vascular congestion without pulmonary edema.   Electronically Signed   By: Maryclare Bean M.D.   On: 12/19/2013 17:44   Dg Chest 2 View  12/19/2013   CLINICAL DATA:  Chest pain today, history of sickle cell in asthma.  EXAM: CHEST  2 VIEW  COMPARISON:  Chest radiograph October 25, 2012  FINDINGS: The cardiac silhouette appears mildly enlarged. Mediastinal silhouette is nonsuspicious. Mild chronic interstitial changes without pleural effusions or focal consolidations. No pneumothorax. Soft tissue planes and included osseous structures are nonsuspicious. Surgical clips in the included right abdomen likely reflect cholecystectomy.  IMPRESSION: Mild cardiomegaly and chronic interstitial changes. No focal consolidation.    Electronically Signed   By: Awilda Metro   On: 12/19/2013 02:32   Ct Angio Chest Pe W/cm &/or Wo Cm  12/19/2013   CLINICAL DATA:  Back and chest pain.  Sickle cell crisis.  EXAM: CT ANGIOGRAPHY CHEST WITH CONTRAST  TECHNIQUE: Multidetector CT imaging of the chest was performed using the standard protocol during bolus administration of intravenous contrast. Multiplanar CT image reconstructions and MIPs were obtained to evaluate the vascular anatomy.  CONTRAST:  OMNIPAQUE IOHEXOL 350 MG/ML SOLN  COMPARISON:  07/22/2012  FINDINGS: Chest wall: Unremarkable. Stable osseous changes of sickle cell disease and stable scoliosis.  Mediastinum: Stable mild cardiac enlargement. No pericardial effusion. No mediastinal or hilar mass or adenopathy. The aorta is normal in caliber. No dissection. The pulmonary arteries are mildly enlarged. No definite filling defects to suggest pulmonary embolism but examination is quite limited due to respiratory motion.  Lungs:  Limited examination by respiratory motion. There are patchy bilateral infiltrates. No pleural effusion or pulmonary edema.  Upper abdomen: Small calcified spleen consistent with chronic sickle cell disease.  Review of the MIP images confirms the above findings.  IMPRESSION: No CT findings for pulmonary embolism.  Bilateral infiltrates.   Electronically Signed   By: Loralie ChampagneMark  Gallerani M.D.   On: 12/19/2013 19:13    EKG: Independently reviewed. Sinus tachycardia.  Assessment/Plan Principal Problem:   Sickle cell crisis acute chest syndrome Active Problems:   Hypertension   Asthma, chronic   Sickle cell pain crisis   1. Sickle cell pain crisis with possible acute chest syndrome - patient has been placed on Dilaudid PCA pump high-dose. Patient states he takes oxycodone only rarely and has been taking only last 2 days. If pain does not get controlled and may have to change to weight and does PCA. Patient has been placed on gentle hydration and since  patient's CAT scan was showing bilateral infiltrates patient also has been placed on antibiotics after blood cultures obtained. Since patient also febrile and have ordered influenza PCR. Check LDH. Continue home medication including hydroxyurea. 2. Hypertension - continue lisinopril. 3. History of asthma - grossly non-wheezing and patient is on when necessary albuterol.    Code Status: full code.  Family Communication: none.  Disposition Plan: admit to inpatient.    Angeleigh Chiasson N. [facility] Pager [phone].  If 7PM-7AM, please contact night-coverage [email or web address] Password [email or web address] 12/19/2013, 8:39 PM

## 2013-12-19 NOTE — ED Provider Notes (Signed)
CSN: [id number]     Arrival date & time 12/19/13  1518 History   First MD Initiated Contact with Patient 12/19/13 1606     Chief Complaint  Patient presents with  . Sickle Cell Pain Crisis     (Consider location/radiation/quality/duration/timing/severity/associated sxs/prior Treatment) HPI Comments: Cameron [name] is a 23 y.o. male with a PMHx of Sickle cell anemia, HTN, and asthma, who presents to the ED with complaints of sickle cell crisis in his usual locations of lower back and R shoulder as well as some mild central nonradiating CP. He was seen here this morning and discharged home with oral pain meds which have not helped. Pain is 4/10 intermittent nonradiating pain, described as spasms in both areas, worse with movement, and unrelieved by tylenol and oxycodone 10mg  taken 2hrs PTA. He does endorse that the oxycodone did finally take and is helping slightly, but has not used it entirely. He denies any recent travel, oxygen 2 change, or dehydration. He states that the weather didn't change becoming colder recently no other known cold exposure. States that over the last week he has not been able to take his hydroxyurea due to confusion with [facility]. He [place] Dr. Toribio HarbourNorman Shaw for his sickle cell care. He denies any fevers, chills, shortness of breath, wheezing, cough, hemoptysis, LE swelling, fatigue, abdominal pain, nausea, vomiting, diarrhea, constipation, dysuria, malodorous urine, paresthesias, numbness, tingling, weakness, cauda equina symptoms, recent immobility or surgery. He's does state that he has had acute chest syndrome in the past. Reports his last sickle cell crisis was in the summer.  Patient is a 23 y.o. male presenting with sickle cell pain. The history is provided by the patient. No language interpreter was used.  Sickle Cell Pain Crisis Location:  Back and upper extremity (lower back and R shoulder) Severity:  Moderate Onset  quality:  Gradual Duration:  1 day Similar to previous crisis episodes: yes   Timing:  Intermittent Progression:  Waxing and waning Chronicity:  Recurrent Frequency of attacks:  Every few months, last attack 4 months ago History of pulmonary emboli: no   Context: cold exposure (weather change) and non-compliance (not taking hydroxyurea)   Relieved by:  Nothing Worsened by:  Movement Ineffective treatments:  Prescription drugs and OTC medications (tylenol and oxycodone 10mg ) Associated symptoms: chest pain   Associated symptoms: no congestion, no cough, no fatigue, no fever, no headaches, no leg ulcers, no nausea, no priapism, no shortness of breath, no sore throat, no swelling of legs, no vomiting and no wheezing   Risk factors: prior acute chest   Risk factors: no frequent admissions for pain and no frequent pain crises     Past Medical History  Diagnosis Date  . Sickle cell anemia   . Hypertension   . Asthma    Past Surgical History  Procedure Laterality Date  . Cholecystectomy     No family history on file. History  Substance Use Topics  . Smoking status: Never Smoker   . Smokeless tobacco: Never Used  . Alcohol Use: No    Review of Systems  Constitutional: Negative for fever, chills and fatigue.  HENT: Negative for congestion, rhinorrhea and sore throat.   Respiratory: Negative for cough, chest tightness, shortness of breath and wheezing.   Cardiovascular: Positive for chest pain. Negative for palpitations and leg swelling.  Gastrointestinal: Negative for nausea, vomiting, abdominal pain, diarrhea and constipation.  Genitourinary: Negative for dysuria, frequency and hematuria.  Musculoskeletal:  Positive for back pain (lower) and arthralgias (R shoulder). Negative for myalgias, joint swelling and neck pain.  Skin: Negative for color change.  Neurological: Negative for dizziness, syncope, weakness, light-headedness, numbness and headaches.  Psychiatric/Behavioral:  Negative for confusion.   10 Systems reviewed and are negative for acute change except as noted in the HPI.    Allergies  Review of patient's allergies indicates no known allergies.  Home Medications   Prior to Admission medications   Medication Sig Start Date End Date Taking? Authorizing Provider  folic acid (FOLVITE) 1 MG tablet Take 1 mg by mouth daily.   Yes Historical Provider, MD  hydroxyurea (HYDREA) 500 MG capsule Take 1,500 mg by mouth daily. May take with food to minimize GI side effects.   Yes Historical Provider, MD  lisinopril (PRINIVIL,ZESTRIL) 10 MG tablet Take 10 mg by mouth daily.     Yes Historical Provider, MD  oxyCODONE (ROXICODONE) 5 MG immediate release tablet Take 3 tablets (15 mg total) by mouth every 4 (four) hours as needed for pain. Take 1-2 tablets every 4-6 hours as needed for pain control Patient taking differently: Take 15 mg by mouth every 4 (four) hours as needed for severe pain. Take 2 tablets every 4 hours as needed for pain control 10/25/12  Yes Nicole Pisciotta, PA-C  albuterol (PROVENTIL HFA;VENTOLIN HFA) 108 (90 BASE) MCG/ACT inhaler Inhale 2 puffs into the lungs every 6 (six) hours as needed. For shortness of breath. 11/17/12   Altha Harm, MD  oxyCODONE-acetaminophen (PERCOCET) 10-325 MG per tablet Take 1 tablet by mouth every 4 (four) hours as needed for pain. 10/26/12   Alison Murray, MD  Pediatric Multiple Vit-C-FA (FLINSTONES GUMMIES OMEGA-3 DHA) CHEW Chew 1 tablet by mouth daily.    Historical Provider, MD   BP 137/95 mmHg  Pulse 126  Temp(Src) 98.2 F (36.8 C) (Oral)  Resp 19  SpO2 93% Physical Exam  Constitutional: He is oriented to person, place, and time. He appears well-developed and well-nourished.  Non-toxic appearance. No distress.  Afebrile, nontoxic, sleepy but arousable, using 2L via [place] with oxygenation >97% but remains tachycardic. Nontoxic appearing.  HENT:  Head: Normocephalic and atraumatic.  Mouth/Throat: Oropharynx  is clear and moist. Mucous membranes are dry.  Mildly dry mucous membranes  Eyes: Conjunctivae and EOM are normal. Right eye exhibits no discharge. Left eye exhibits no discharge.  Bilaterally constricted pupils  Neck: Normal range of motion. Neck supple.  Cardiovascular: Regular rhythm and intact distal pulses.  Tachycardia present.  Exam reveals no gallop and no friction rub.   Murmur heard. Flow murmur auscultated, tachycardic, distal pulses intact, no pedal edema  Pulmonary/Chest: Effort normal and breath sounds normal. No respiratory distress. He has no decreased breath sounds. He has no wheezes. He has no rhonchi. He has no rales.  CTAB in all lung fields, chest wall nonTTP  Abdominal: Soft. Normal appearance and bowel sounds are normal. He exhibits no distension. There is no tenderness. There is no rigidity, no rebound, no guarding, no tenderness at McBurney's point and negative Murphy's sign.  Musculoskeletal: Normal range of motion.  R shoulder with FROM intact, no TTP or crepitus. All spinal levels without midline bony TTP or crepitus, no step offs or deformities. Mild b/l lower lumbar spine TTP and spasm in paraspinous muscles. Strength 5/5 in all extremities, sensation grossly intact in all extremities.  Neurological: He is alert and oriented to person, place, and time. He has normal strength. No sensory deficit.  Skin:  Skin is warm, dry and intact. No rash noted.  Psychiatric: He has a normal mood and affect.  Nursing note and vitals reviewed.   ED Course  Procedures (including critical care time) Labs Review Labs Reviewed  CBC WITH DIFFERENTIAL - Abnormal; Notable for the following:    WBC 18.8 (*)    RBC 2.56 (*)    Hemoglobin 8.8 (*)    HCT 24.6 (*)    MCH 34.4 (*)    RDW 24.6 (*)    Neutro Abs 13.9 (*)    Monocytes Absolute 2.1 (*)    All other components within normal limits  COMPREHENSIVE METABOLIC PANEL - Abnormal; Notable for the following:    Sodium 136 (*)     Glucose, Bld 115 (*)    AST 47 (*)    Total Bilirubin 7.2 (*)    All other components within normal limits  RETICULOCYTES - Abnormal; Notable for the following:    Retic Ct Pct >23.0 (*)    RBC. 2.56 (*)    All other components within normal limits  URINALYSIS, ROUTINE W REFLEX MICROSCOPIC - Abnormal; Notable for the following:    Color, Urine AMBER (*)    Hgb urine dipstick TRACE (*)    All other components within normal limits  URINE MICROSCOPIC-ADD ON  Rosezena SensorI-STAT TROPOININ, ED    Imaging Review Dg Chest 2 View  12/19/2013   CLINICAL DATA:  Sickle cell crisis  EXAM: CHEST  2 VIEW  COMPARISON:  12/19/2013 200 hr  FINDINGS: Low lung volumes. Bibasilar atelectasis. Cardiomegaly. Vascular congestion. No evidence of interstitial edema.  IMPRESSION: Increasing bibasilar atelectasis and vascular congestion without pulmonary edema.   Electronically Signed   By: Maryclare BeanArt  Hoss M.D.   On: 12/19/2013 17:44   Dg Chest 2 View  12/19/2013   CLINICAL DATA:  Chest pain today, history of sickle cell in asthma.  EXAM: CHEST  2 VIEW  COMPARISON:  Chest radiograph October 25, 2012  FINDINGS: The cardiac silhouette appears mildly enlarged. Mediastinal silhouette is nonsuspicious. Mild chronic interstitial changes without pleural effusions or focal consolidations. No pneumothorax. Soft tissue planes and included osseous structures are nonsuspicious. Surgical clips in the included right abdomen likely reflect cholecystectomy.  IMPRESSION: Mild cardiomegaly and chronic interstitial changes. No focal consolidation.   Electronically Signed   By: Awilda Metroourtnay  Bloomer   On: 12/19/2013 02:32   Ct Angio Chest Pe W/cm &/or Wo Cm  12/19/2013   CLINICAL DATA:  Back and chest pain.  Sickle cell crisis.  EXAM: CT ANGIOGRAPHY CHEST WITH CONTRAST  TECHNIQUE: Multidetector CT imaging of the chest was performed using the standard protocol during bolus administration of intravenous contrast. Multiplanar CT image reconstructions and MIPs  were obtained to evaluate the vascular anatomy.  CONTRAST:  100mL OMNIPAQUE IOHEXOL 350 MG/ML SOLN  COMPARISON:  07/22/2012  FINDINGS: Chest wall: Unremarkable. Stable osseous changes of sickle cell disease and stable scoliosis.  Mediastinum: Stable mild cardiac enlargement. No pericardial effusion. No mediastinal or hilar mass or adenopathy. The aorta is normal in caliber. No dissection. The pulmonary arteries are mildly enlarged. No definite filling defects to suggest pulmonary embolism but examination is quite limited due to respiratory motion.  Lungs: Limited examination by respiratory motion. There are patchy bilateral infiltrates. No pleural effusion or pulmonary edema.  Upper abdomen: Small calcified spleen consistent with chronic sickle cell disease.  Review of the MIP images confirms the above findings.  IMPRESSION: No CT findings for pulmonary embolism.  Bilateral infiltrates.  Electronically Signed   By: Loralie Champagne M.D.   On: 12/19/2013 19:13     EKG Interpretation   Date/Time:  Monday December 19 2013 15:31:17 EST Ventricular Rate:  137 PR Interval:  133 QRS Duration: 83 QT Interval:  396 QTC Calculation: 598 R Axis:   48 Text Interpretation:  Sinus tachycardia LAE, consider biatrial enlargement  Prolonged QT interval Confirmed by Mirian Mo [phone]) on 12/19/2013  3:38:38 PM      MDM   Final diagnoses:  Sickle cell anemia with crisis  Chest pain  Acute chest syndrome    23y/o male with [place] crisis, seen earlier. Oxygenation worsened now requiring O2 to maintain sats >95%, and tachycardic. EKG with sinus tachy, CBC w/diff showing slightly worsened WBC, H/H slightly decreased from this AM. CMP with mildly worsened bili and AST. Retic count similar to this AM. Will obtain repeat CXR, as well as obtain u/a and trop. Concern for PE vs acute chest given his tachycardia and worsening oxygenation, will await CXR and possibly scan chest for PE. Will ambulate with oximetry.  Likely admit.  5:59 PM CXR showing bibasilar atelectasis with vasc congestion without pulm edema. Will proceed with PE scan.   6:25 PM Ambulated on RA and saturations maintained >94% but afterwards he vomited, given phenergan. Awaiting scan. Will give another dose of dilaudid since pt states pain is unchanged. U/A unremarkable. Trop neg.   7:17 PM CTA showing bibasilar infiltrates, therefore I believe this represents acute chest syndrome. Will consult for admission now.  7:28 PM Dr. Toniann Fail returning page, will admit to tele bed. Please see his documentation for further discussion of care.  [place], [place] 12/19/13 1929  Mirian Mo, MD 12/21/13 [phone]

## 2013-12-19 NOTE — ED Provider Notes (Signed)
CSN: [id number]     Arrival date & time 12/19/13  0144 History   First MD Initiated Contact with Patient 12/19/13 0149     Chief Complaint  Patient presents with  . Sickle Cell Pain Crisis     (Consider location/radiation/quality/duration/timing/severity/associated sxs/prior Treatment) Patient is a 23 y.o. male presenting with sickle cell pain. The history is provided by the patient. No language interpreter was used.  Sickle Cell Pain Crisis Location:  Back Severity:  Severe Onset quality:  Gradual Duration:  2 days Similar to previous crisis episodes: yes   Timing:  Constant Progression:  Worsening Chronicity:  Recurrent Associated symptoms: chest pain   Associated symptoms: no cough, no fever and no shortness of breath   Associated symptoms comment:  Pain located in lower back radiating to hips and a mild chest discomfort. No fever or cough. No N, V. He states pain is typical of previous sickle cell crises.   Past Medical History  Diagnosis Date  . Sickle cell anemia   . Hypertension   . Asthma    Past Surgical History  Procedure Laterality Date  . Cholecystectomy     No family history on file. History  Substance Use Topics  . Smoking status: Never Smoker   . Smokeless tobacco: Never Used  . Alcohol Use: No    Review of Systems  Constitutional: Negative for fever and chills.  HENT: Negative.   Respiratory: Negative.  Negative for cough and shortness of breath.   Cardiovascular: Positive for chest pain.  Gastrointestinal: Negative.   Genitourinary: Negative.   Musculoskeletal: Positive for back pain.  Skin: Negative.   Neurological: Negative.       Allergies  Review of patient's allergies indicates no known allergies.  Home Medications   Prior to Admission medications   Medication Sig Start Date End Date Taking? Authorizing Provider  folic acid (FOLVITE) 1 MG tablet Take 1 mg by mouth daily.   Yes Historical Provider, MD  hydroxyurea (HYDREA) 500 MG  capsule Take 1,000 mg by mouth daily. May take with food to minimize GI side effects.   Yes Historical Provider, MD  lisinopril (PRINIVIL,ZESTRIL) 10 MG tablet Take 10 mg by mouth daily.     Yes Historical Provider, MD  Pediatric Multiple Vit-C-FA (FLINSTONES GUMMIES OMEGA-3 DHA) CHEW Chew 1 tablet by mouth daily.   Yes Historical Provider, MD  albuterol (PROVENTIL HFA;VENTOLIN HFA) 108 (90 BASE) MCG/ACT inhaler Inhale 2 puffs into the lungs every 6 (six) hours as needed. For shortness of breath. 11/17/12   Altha HarmMichelle A Matthews, MD  oxyCODONE (ROXICODONE) 5 MG immediate release tablet Take 3 tablets (15 mg total) by mouth every 4 (four) hours as needed for pain. Take 1-2 tablets every 4-6 hours as needed for pain control 10/25/12   Joni ReiningNicole Pisciotta, PA-C  oxyCODONE-acetaminophen (PERCOCET) 10-325 MG per tablet Take 1 tablet by mouth every 4 (four) hours as needed for pain. 10/26/12   Alison MurrayAlma M Devine, MD   BP 166/87 mmHg  Pulse 107  Temp(Src) 97.9 F (36.6 C) (Oral)  Resp 18  SpO2 97% Physical Exam  Constitutional: He is oriented to person, place, and time. He appears well-developed and well-nourished.  HENT:  Head: Normocephalic.  Neck: Normal range of motion. Neck supple.  Cardiovascular: Normal rate and regular rhythm.   Murmur heard. Pulmonary/Chest: Effort normal and breath sounds normal. He has no wheezes. He has no rales.  Abdominal: Soft. Bowel sounds are normal. There is no tenderness. There is no rebound and  no guarding.  Musculoskeletal: Normal range of motion.  Midline lower back tenderness.   Neurological: He is alert and oriented to person, place, and time.  Skin: Skin is warm and dry. No rash noted.  Psychiatric: He has a normal mood and affect.    ED Course  Procedures (including critical care time) Labs Review Labs Reviewed  CBC WITH DIFFERENTIAL  RETICULOCYTES  COMPREHENSIVE METABOLIC PANEL   Results for orders placed or performed during the hospital encounter of  12/19/13  CBC with Differential  Result Value Ref Range   WBC 16.6 (H) 4.0 - 10.5 K/uL   RBC 2.67 (L) 4.22 - 5.81 MIL/uL   Hemoglobin 9.2 (L) 13.0 - 17.0 g/dL   HCT 16.125.3 (L) 09.639.0 - 04.552.0 %   MCV 94.8 78.0 - 100.0 fL   MCH 34.5 (H) 26.0 - 34.0 pg   MCHC 36.4 (H) 30.0 - 36.0 g/dL   RDW 40.924.6 (H) 81.111.5 - 91.415.5 %   Platelets 382 150 - 400 K/uL   Neutrophils Relative % 52 43 - 77 %   Lymphocytes Relative 32 12 - 46 %   Monocytes Relative 12 3 - 12 %   Eosinophils Relative 3 0 - 5 %   Basophils Relative 1 0 - 1 %   Neutro Abs 8.6 (H) 1.7 - 7.7 K/uL   Lymphs Abs 5.3 (H) 0.7 - 4.0 K/uL   Monocytes Absolute 2.0 (H) 0.1 - 1.0 K/uL   Eosinophils Absolute 0.5 0.0 - 0.7 K/uL   Basophils Absolute 0.2 (H) 0.0 - 0.1 K/uL   RBC Morphology POLYCHROMASIA PRESENT    WBC Morphology MILD LEFT SHIFT (1-5% METAS, OCC MYELO, OCC BANDS)    Smear Review LARGE PLATELETS PRESENT   Reticulocytes  Result Value Ref Range   Retic Ct Pct >23.0 (H) 0.4 - 3.1 %   RBC. 2.67 (L) 4.22 - 5.81 MIL/uL   Retic Count, Manual NOT CALCULATED 19.0 - 186.0 K/uL  Comprehensive metabolic panel  Result Value Ref Range   Sodium 141 137 - 147 mEq/L   Potassium 4.6 3.7 - 5.3 mEq/L   Chloride 103 96 - 112 mEq/L   CO2 24 19 - 32 mEq/L   Glucose, Bld 104 (H) 70 - 99 mg/dL   BUN 9 6 - 23 mg/dL   Creatinine, Ser 7.820.59 0.50 - 1.35 mg/dL   Calcium 9.6 8.4 - 95.610.5 mg/dL   Total Protein 8.5 (H) 6.0 - 8.3 g/dL   Albumin 4.8 3.5 - 5.2 g/dL   AST 39 (H) 0 - 37 U/L   ALT 15 0 - 53 U/L   Alkaline Phosphatase 82 39 - 117 U/L   Total Bilirubin 6.2 (H) 0.3 - 1.2 mg/dL   GFR calc non Af Amer >90 >90 mL/min   GFR calc Af Amer >90 >90 mL/min   Anion gap 14 5 - 15   Dg Chest 2 View  12/19/2013   CLINICAL DATA:  Chest pain today, history of sickle cell in asthma.  EXAM: CHEST  2 VIEW  COMPARISON:  Chest radiograph October 25, 2012  FINDINGS: The cardiac silhouette appears mildly enlarged. Mediastinal silhouette is nonsuspicious. Mild chronic  interstitial changes without pleural effusions or focal consolidations. No pneumothorax. Soft tissue planes and included osseous structures are nonsuspicious. Surgical clips in the included right abdomen likely reflect cholecystectomy.  IMPRESSION: Mild cardiomegaly and chronic interstitial changes. No focal consolidation.   Electronically Signed   By: Awilda Metroourtnay  Bloomer   On: 12/19/2013 02:32  Imaging Review No results found.   EKG Interpretation None      MDM   Final diagnoses:  Chest pain    1. Sickle cell crisis  Pain is improved with IV pain medication. No evidence of acute chest syndrome. VSS, afebrile. His Sickle Cell is followed at [facility]. Feel he can be discharged home to follow up with his regular physician outpatient.   Arnoldo Hooker, PA-C 12/19/13 1610  Olivia Mackie, MD 12/19/13 [phone]

## 2013-12-19 NOTE — ED Notes (Signed)
Pt states that he is having a sickle cell crisis; pt states that he is having back pain; pt states that this is typical Sickle Cell pain for him

## 2013-12-19 NOTE — ED Notes (Signed)
Patient ambulated to the bathroom c/o legs feeling weak.

## 2013-12-19 NOTE — ED Notes (Addendum)
Pt ambulated pulse oximetry 94% on RA. Post ambulation pt actively vomiting.

## 2013-12-19 NOTE — Progress Notes (Signed)
ANTIBIOTIC CONSULT NOTE - INITIAL  Pharmacy Consult for Levaquin Indication: pneumonia  No Known Allergies  Patient Measurements: Height: 5\' 5"  (165.1 cm) Weight: 143 lb 11.8 oz (65.2 kg) IBW/kg (Calculated) : 61.5  Vital Signs: Temp: 102.1 F (38.9 C) (11/16 2010) Temp Source: Oral (11/16 2010) BP: 169/65 mmHg (11/16 2010) Pulse Rate: 117 (11/16 2010) Intake/Output from previous day:   Intake/Output from this shift:    Labs:  Recent Labs  12/19/13 0316 12/19/13 1524  WBC 16.6* 18.8*  HGB 9.2* 8.8*  PLT 382 274  CREATININE 0.59 0.63   Estimated Creatinine Clearance: 124.9 mL/min (by C-G formula based on Cr of 0.63). No results for input(s): VANCOTROUGH, VANCOPEAK, VANCORANDOM, GENTTROUGH, GENTPEAK, GENTRANDOM, TOBRATROUGH, TOBRAPEAK, TOBRARND, AMIKACINPEAK, AMIKACINTROU, AMIKACIN in the last 72 hours.   Microbiology: No results found for this or any previous visit (from the past 720 hour(s)).  Medical History: Past Medical History  Diagnosis Date  . Sickle cell anemia   . Hypertension   . Asthma     Medications:  Scheduled:  . enoxaparin (LOVENOX) injection  40 mg Subcutaneous Q24H  . [START ON 12/20/2013] folic acid  1 mg Oral Daily  . HYDROmorphone PCA 0.3 mg/mL   Intravenous 6 times per day  . hydroxyurea  1,500 mg Oral Daily  . [START ON 12/20/2013] Influenza vac split quadrivalent PF  0.5 mL Intramuscular Tomorrow-1000  . lisinopril  10 mg Oral Daily  . senna-docusate  1 tablet Oral BID   Infusions:  . sodium chloride     PRN: albuterol, diphenhydrAMINE **OR** diphenhydrAMINE, ketorolac, naloxone **AND** sodium chloride, ondansetron (ZOFRAN) IV, polyethylene glycol  Assessment: 23 yo male with asthma and sickle cell disease presents with pain crisis. CT angiogram shows bibasilar infiltrates concerning for acute chest syndrome. Pharmacy consulted to dose levaquin.  11/16 >> Levaquin >>  Tmax: 102.1 WBC: 18.8k Renal: SCr 0.63, CrCl > 100  ml/min  11/16 blood x 2: ordered  Goal of Therapy:  Eradication of infection Dose appropriate for indication/renal function  Plan:   Levaquin 750mg  IV q24h Follow up renal function & cultures Follow up transition to PO  Loralee PacasErin Rajah Lamba, PharmD, BCPS Pager: [phone] 12/19/2013,8:42 PM

## 2013-12-20 DIAGNOSIS — R000 Tachycardia, unspecified: Secondary | ICD-10-CM | POA: Diagnosis present

## 2013-12-20 DIAGNOSIS — R0902 Hypoxemia: Secondary | ICD-10-CM | POA: Diagnosis present

## 2013-12-20 DIAGNOSIS — R509 Fever, unspecified: Secondary | ICD-10-CM | POA: Diagnosis present

## 2013-12-20 DIAGNOSIS — D5700 Hb-SS disease with crisis, unspecified: Secondary | ICD-10-CM

## 2013-12-20 LAB — COMPREHENSIVE METABOLIC PANEL
ALBUMIN: 3.9 g/dL (ref 3.5–5.2)
ALT: 32 U/L (ref 0–53)
ANION GAP: 13 (ref 5–15)
AST: 43 U/L — AB (ref 0–37)
Alkaline Phosphatase: 55 U/L (ref 39–117)
BUN: 11 mg/dL (ref 6–23)
CALCIUM: 8.9 mg/dL (ref 8.4–10.5)
CO2: 26 mEq/L (ref 19–32)
Chloride: 99 mEq/L (ref 96–112)
Creatinine, Ser: 0.66 mg/dL (ref 0.50–1.35)
GFR calc Af Amer: >90 mL/min (ref >90)
GFR calc non Af Amer: >90 mL/min (ref >90)
Glucose, Bld: 107 mg/dL — ABNORMAL HIGH (ref 70–99)
Potassium: 4.2 mEq/L (ref 3.7–5.3)
Sodium: 138 mEq/L (ref 137–147)
Total Bilirubin: 7.8 mg/dL — ABNORMAL HIGH (ref 0.3–1.2)
Total Protein: 7.4 g/dL (ref 6.0–8.3)

## 2013-12-20 LAB — CBC WITH DIFFERENTIAL/PLATELET
BLASTS: 0 %
Band Neutrophils: 0 % (ref 0–10)
Basophils Absolute: 0 10*3/uL (ref 0.0–0.1)
Basophils Relative: 0 % (ref 0–1)
EOS ABS: 0 10*3/uL (ref 0.0–0.7)
EOS PCT: 0 % (ref 0–5)
HCT: 20.9 % — ABNORMAL LOW (ref 39.0–52.0)
Hemoglobin: 7.7 g/dL — ABNORMAL LOW (ref 13.0–17.0)
LYMPHS ABS: 1.7 10*3/uL (ref 0.7–4.0)
Lymphocytes Relative: 11 % — ABNORMAL LOW (ref 12–46)
MCH: 34.2 pg — ABNORMAL HIGH (ref 26.0–34.0)
MCHC: 36.8 g/dL — ABNORMAL HIGH (ref 30.0–36.0)
MCV: 92.9 fL (ref 78.0–100.0)
METAMYELOCYTES PCT: 0 %
MONO ABS: 0.8 10*3/uL (ref 0.1–1.0)
MONOS PCT: 5 % (ref 3–12)
Myelocytes: 0 %
NEUTROS ABS: 13.1 10*3/uL — AB (ref 1.7–7.7)
Neutrophils Relative %: 84 % — ABNORMAL HIGH (ref 43–77)
Platelets: 269 10*3/uL (ref 150–400)
Promyelocytes Absolute: 0 %
RBC: 2.25 MIL/uL — AB (ref 4.22–5.81)
RDW: 23.6 % — ABNORMAL HIGH (ref 11.5–15.5)
WBC: 15.6 10*3/uL — ABNORMAL HIGH (ref 4.0–10.5)
nRBC: 6 /100 WBC — ABNORMAL HIGH

## 2013-12-20 LAB — PREPARE RBC (CROSSMATCH)

## 2013-12-20 LAB — INFLUENZA PANEL BY PCR (TYPE A & B)
H1N1FLUPCR: NOT DETECTED
INFLBPCR: NEGATIVE
Influenza A By PCR: NEGATIVE

## 2013-12-20 MED ORDER — SODIUM CHLORIDE 0.9 % IV SOLN
Freq: Once | INTRAVENOUS | Status: AC
  Administered 2013-12-20: 17:00:00 via INTRAVENOUS

## 2013-12-20 MED ORDER — KETOROLAC TROMETHAMINE 30 MG/ML IJ SOLN
30.0000 mg | Freq: Four times a day (QID) | INTRAMUSCULAR | Status: DC
Start: 2013-12-20 — End: 2013-12-24
  Administered 2013-12-20 – 2013-12-24 (×15): 30 mg via INTRAVENOUS
  Filled 2013-12-20 (×23): qty 1

## 2013-12-20 NOTE — Progress Notes (Signed)
SICKLE CELL SERVICE PROGRESS NOTE  Cameron [name] ZOX:[id number] DOB: 1990/11/29 DOA: 12/19/2013 PCP: No PCP Per Patient  Assessment/Plan: Principal Problem:   Sickle cell crisis acute chest syndrome Active Problems:   Hypertension   Asthma, chronic   Sickle cell pain crisis   Tachycardia  1. 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 He has a baseline Hb of 8-9 and Hb is currently 7.7 g/dL. In light of acute chest, goal is to keep Hb above 9. His cultures so far have been negative but if her continues to have a fever despite Levaquin, I will broaden the spectrum of antibiotics. With regard to his pain, he has used only 9.6 mg in the last 24 hours with 42/32: demands/deliveries. I will add Toradol to regimen and continue to monitor. I hope to see improvements with transfusion. Will obtain Hb electrophoresis.  2. Leukocytosis: Associated with Acute chest syndrome and Pneumonia. Continue to monitor 3. Anemia: Pt with mild hemolysis. Will check Hb post-transfusion 4. Sickle Cell Disease: Pt has been out of hydrea for 2 weeks. This is likely what pushed him into a crisis. I have spoken with Dr. Margaretmary [name] office and his prescription for Hydrea is available for him at the Pharmacy.   Code Status: Full Code Family Communication: N/A Disposition Plan: Not yet ready for discharge  Cameron [name] A.  Pager [phone]. If 7PM-7AM, please contact night-coverage.  12/20/2013, 12:26 PM  LOS: 1 day   Admitting History: Cameron [name] is a 23 y.o. male with history of sickle cell disease, hypertension and asthma results to ER because of ongoing low back pain and chest pain over the last 2 days. Patient had originally come to the ER and was discharged home after treating him for pain but had to come by because  of worsening pain. Patient also has been experiencing some chest pain bilaterally in the lower part of the chest. Denies any associated cough or shortness of breath. Since patient was found to be tachycardic patient had a CT angiography of the chest which was negative for PE but was showing bilateral infiltrates. Patient has been admitted for further management for sickle cell pain crisis with possible acute chest syndrome. Patient was also found to be febrile. Patient had mild nausea but denies any vomiting or abdominal pain or any dysuria. Patient denies any headache visual symptoms or any focal deficits. Patient states he has not taken his hydroxyurea for last 2 weeks because she had ran out of it.   Consultants:  None  Procedures:  none  Antibiotics:  Levaquin 11/16 >>  HPI/Subjective: Pt states that he feels better and that his pain is localized only to his back. I observed him ambulating and patient is not at his baseline at present.   Objective: Filed Vitals:   12/20/13 0417 12/20/13 0418 12/20/13 0821 12/20/13 0945  BP:  140/58  136/46  Pulse:    118  Temp:    100.4 F (38 C)  TempSrc:      Resp: 22  21 17   Height:      Weight:      SpO2: 97%  97% 95%   Weight change:   Intake/Output Summary (Last 24 hours) at 12/20/13 1226 Last data filed at 12/20/13 0900  Gross per 24 hour  Intake    640 ml  Output    700 ml  Net    -60 ml    General: Alert, awake, oriented x3, in no acute distress.  HEENT: [place]/AT PEERL, EOMI, icteric Neck: Trachea midline,  no masses, no thyromegal,y no JVD, no carotid bruit OROPHARYNX:  Moist, No exudate/ erythema/lesions.  Heart: Regular rhythm but tachycardic, without murmurs, rubs, gallops.  Lungs: Clear to auscultation, no wheezing or rhonchi noted.  Abdomen: Soft, nontender, nondistended, positive bowel sounds, no masses no hepatosplenomegaly noted.  Neuro: No focal neurological deficits noted cranial nerves II through XII grossly  intact. Strength functional in bilateral upper and lower extremities. Musculoskeletal: No warm swelling or erythema around joints, no spinal tenderness noted. Psychiatric: Patient alert and oriented x3, good insight and cognition, good recent to remote recall. Lymph node survey: No cervical axillary or inguinal lymphadenopathy noted.   Data Reviewed: Basic Metabolic Panel:  Recent Labs Lab 12/19/13 0316 12/19/13 1524 12/20/13 0545  NA 141 136* 138  K 4.6 4.3 4.2  CL 103 98 99  CO2 24 24 26   GLUCOSE 104* 115* 107*  BUN 9 9 11   CREATININE 0.59 0.63 0.66  CALCIUM 9.6 9.7 8.9   Liver Function Tests:  Recent Labs Lab 12/19/13 0316 12/19/13 1524 12/20/13 0545  AST 39* 47* 43*  ALT 15 29 32  ALKPHOS 82 71 55  BILITOT 6.2* 7.2* 7.8*  PROT 8.5* 8.2 7.4  ALBUMIN 4.8 4.7 3.9   No results for input(s): LIPASE, AMYLASE in the last 168 hours. No results for input(s): AMMONIA in the last 168 hours. CBC:  Recent Labs Lab 12/19/13 0316 12/19/13 1524 12/20/13 0545  WBC 16.6* 18.8* 15.6*  NEUTROABS 8.6* 13.9* 13.1*  HGB 9.2* 8.8* 7.7*  HCT 25.3* 24.6* 20.9*  MCV 94.8 96.1 92.9  PLT 382 274 269   Cardiac Enzymes:  Recent Labs Lab 12/19/13 2127  TROPONINI <0.30   BNP (last 3 results) No results for input(s): PROBNP in the last 8760 hours. CBG: No results for input(s): GLUCAP in the last 168 hours.  No results found for this or any previous visit (from the past 240 hour(s)).   Studies: Dg Chest 2 View  12/19/2013   CLINICAL DATA:  Sickle cell crisis  EXAM: CHEST  2 VIEW  COMPARISON:  12/19/2013 200 hr  FINDINGS: Low lung volumes. Bibasilar atelectasis. Cardiomegaly. Vascular congestion. No evidence of interstitial edema.  IMPRESSION: Increasing bibasilar atelectasis and vascular congestion without pulmonary edema.   Electronically Signed   By: Cameron BeanArt  [name] M.D.   On: 12/19/2013 17:44   Dg Chest 2 View  12/19/2013   CLINICAL DATA:  Chest pain today, history of sickle  cell in asthma.  EXAM: CHEST  2 VIEW  COMPARISON:  Chest radiograph October 25, 2012  FINDINGS: The cardiac silhouette appears mildly enlarged. Mediastinal silhouette is nonsuspicious. Mild chronic interstitial changes without pleural effusions or focal consolidations. No pneumothorax. Soft tissue planes and included osseous structures are nonsuspicious. Surgical clips in the included right abdomen likely reflect cholecystectomy.  IMPRESSION: Mild cardiomegaly and chronic interstitial changes. No focal consolidation.   Electronically Signed   By: Awilda Metroourtnay  Bloomer   On: 12/19/2013 02:32   Ct Angio Chest Pe W/cm &/or Wo Cm  12/19/2013   CLINICAL DATA:  Back and chest pain.  Sickle cell crisis.  EXAM: CT ANGIOGRAPHY CHEST WITH CONTRAST  TECHNIQUE: Multidetector CT imaging of the chest was performed using the standard protocol during bolus administration of intravenous contrast. Multiplanar CT image reconstructions and MIPs were  obtained to evaluate the vascular anatomy.  CONTRAST:  100mL OMNIPAQUE IOHEXOL 350 MG/ML SOLN  COMPARISON:  07/22/2012  FINDINGS: Chest wall: Unremarkable. Stable osseous changes of sickle cell disease and stable scoliosis.  Mediastinum: Stable mild cardiac enlargement. No pericardial effusion. No mediastinal or hilar mass or adenopathy. The aorta is normal in caliber. No dissection. The pulmonary arteries are mildly enlarged. No definite filling defects to suggest pulmonary embolism but examination is quite limited due to respiratory motion.  Lungs: Limited examination by respiratory motion. There are patchy bilateral infiltrates. No pleural effusion or pulmonary edema.  Upper abdomen: Small calcified spleen consistent with chronic sickle cell disease.  Review of the MIP images confirms the above findings.  IMPRESSION: No CT findings for pulmonary embolism.  Bilateral infiltrates.   Electronically Signed   By: Loralie ChampagneMark  Gallerani M.D.   On: 12/19/2013 19:13    Scheduled Meds: . sodium  chloride   Intravenous Once  . antiseptic oral rinse  7 mL Mouth Rinse q12n4p  . chlorhexidine  15 mL Mouth Rinse BID  . enoxaparin (LOVENOX) injection  40 mg Subcutaneous Q24H  . folic acid  1 mg Oral Daily  . HYDROmorphone PCA 0.3 mg/mL   Intravenous 6 times per day  . hydroxyurea  1,500 mg Oral Daily  . levofloxacin (LEVAQUIN) IV  750 mg Intravenous Q24H  . lisinopril  10 mg Oral Daily  . senna-docusate  1 tablet Oral BID   Continuous Infusions: . sodium chloride 100 mL/hr at 12/19/13 2130    Time spent 50 minutes.

## 2013-12-20 NOTE — Progress Notes (Signed)
Pt A&Ox4  Pain 7/10 24 hour PCA results Total dose 3 mg Demands 15 Delivered 10

## 2013-12-21 LAB — CBC WITH DIFFERENTIAL/PLATELET
BASOS PCT: 0 % (ref 0–1)
Basophils Absolute: 0 10*3/uL (ref 0.0–0.1)
Eosinophils Absolute: 0.1 10*3/uL (ref 0.0–0.7)
Eosinophils Relative: 1 % (ref 0–5)
HEMATOCRIT: 22.4 % — AB (ref 39.0–52.0)
HEMOGLOBIN: 8.1 g/dL — AB (ref 13.0–17.0)
Lymphocytes Relative: 6 % — ABNORMAL LOW (ref 12–46)
Lymphs Abs: 0.9 10*3/uL (ref 0.7–4.0)
MCH: 33.1 pg (ref 26.0–34.0)
MCHC: 36.2 g/dL — ABNORMAL HIGH (ref 30.0–36.0)
MCV: 91.4 fL (ref 78.0–100.0)
Monocytes Absolute: 1.5 10*3/uL — ABNORMAL HIGH (ref 0.1–1.0)
Monocytes Relative: 10 % (ref 3–12)
NRBC: 2 /100{WBCs} — AB
Neutro Abs: 12.3 10*3/uL — ABNORMAL HIGH (ref 1.7–7.7)
Neutrophils Relative %: 83 % — ABNORMAL HIGH (ref 43–77)
Platelets: 252 10*3/uL (ref 150–400)
RBC: 2.45 MIL/uL — AB (ref 4.22–5.81)
RDW: 21.6 % — ABNORMAL HIGH (ref 11.5–15.5)
WBC: 14.8 10*3/uL — ABNORMAL HIGH (ref 4.0–10.5)

## 2013-12-21 LAB — BASIC METABOLIC PANEL
ANION GAP: 10 (ref 5–15)
BUN: 8 mg/dL (ref 6–23)
CO2: 25 meq/L (ref 19–32)
Calcium: 8.8 mg/dL (ref 8.4–10.5)
Chloride: 103 mEq/L (ref 96–112)
Creatinine, Ser: 0.56 mg/dL (ref 0.50–1.35)
GFR calc Af Amer: >90 mL/min (ref >90)
GLUCOSE: 114 mg/dL — AB (ref 70–99)
Potassium: 4 mEq/L (ref 3.7–5.3)
SODIUM: 138 meq/L (ref 137–147)

## 2013-12-21 LAB — LACTATE DEHYDROGENASE: LDH: 549 U/L — AB (ref 94–250)

## 2013-12-21 MED ORDER — HYDROMORPHONE HCL 1 MG/ML IJ SOLN: 0.5000 mg | INTRAMUSCULAR | Status: DC | PRN

## 2013-12-21 MED ORDER — OXYCODONE HCL 5 MG PO TABS
15.0000 mg | ORAL_TABLET | ORAL | Status: DC
  Administered 2013-12-21 – 2013-12-23 (×11): 15 mg via ORAL
  Filled 2013-12-21 (×11): qty 3

## 2013-12-21 MED ORDER — DIPHENHYDRAMINE HCL 25 MG PO CAPS: 25.0000 mg | ORAL_CAPSULE | Freq: Four times a day (QID) | ORAL | Status: DC | PRN

## 2013-12-21 NOTE — Progress Notes (Signed)
SICKLE CELL SERVICE PROGRESS NOTE  Cameron [name] WUJ:[id number] DOB: 1991-01-30 DOA: 12/19/2013 PCP: No PCP Per Patient  Assessment/Plan: Principal Problem:   Sickle cell crisis acute chest syndrome Active Problems:   Hypertension   Asthma, chronic   Sickle cell pain crisis   Tachycardia   Fever   Hypoxemia  1. Hb SS with crisis and Acute Chest Syndrome: Pt presents with crisis and mild acute chest syndrome., He is currently on Levaquin for treatment of community acquired pneumonia and although  Hypoxia is improved, he still has low saturations. His pain is markedly decreased and he has used only 4.8 mg with 22/26:demands/deliveries of Dilaudid on the PCA. I will discontinue the PCA and schedule Oxycodone. I will continue use of Toradol and add clinician assisted IV Dilaudid for breakthrough pain. 2. Anemia: He has a baseline Hb of 8-9 and Hb at a Hb of7.7 g/dL. In light of acute chest and a  goal to keep Hb above 8, he was transfused 1 unit PRBC's and his current Hb is 8.1 g/dL.  3. Fever: Her continues to have fevers although peak temperatures are less than they had been.  His cultures so far have been negative but if her continues to have a fever despite  Day# 2/8 of Levaquin, I will broaden the spectrum of antibiotics.  4. Leukocytosis: Associated with Acute chest syndrome and Pneumonia. Continue to monitor 5. Sickle Cell Disease: Pt has been out of hydrea for 2 weeks. This is likely what pushed him into a crisis. I have spoken with Dr. Margaretmary Eddy office and his prescription for Hydrea is available for him at the Pharmacy.   Code Status: Full Code Family Communication: N/A Disposition Plan: Anticipate discharge Home tomorrow discharge  MATTHEWS,MICHELLE A.  Pager [phone]. If 7PM-7AM, please contact night-coverage.  12/21/2013, 3:15 PM  LOS: 2 days   Admitting History: Cameron [name] is a 23 y.o. male with history of sickle cell disease, hypertension and asthma results to ER because  of ongoing low back pain and chest pain over the last 2 days. Patient had originally come to the ER and was discharged home after treating him for pain but had to come by because of worsening pain. Patient also has been experiencing some chest pain bilaterally in the lower part of the chest. Denies any associated cough or shortness of breath. Since patient was found to be tachycardic patient had a CT angiography of the chest which was negative for PE but was showing bilateral infiltrates. Patient has been admitted for further management for sickle cell pain crisis with possible acute chest syndrome. Patient was also found to be febrile. Patient had mild nausea but denies any vomiting or abdominal pain or any dysuria. Patient denies any headache visual symptoms or any focal deficits. Patient states he has not taken his hydroxyurea for last 2 weeks because she had ran out of it.   Consultants:  None  Procedures:  none  Antibiotics:  Levaquin 11/16 >>  HPI/Subjective: Pt states that he feels better and that his pain is localized only to his back. Last BM 2 days ago.  Objective: Filed Vitals:   12/21/13 0805 12/21/13 1200 12/21/13 1300 12/21/13 1429  BP:    134/59  Pulse:    108  Temp: 99.1 F (37.3 C)   99.1 F (37.3 C)  TempSrc: Oral   Oral  Resp:  20  24  Height:      Weight:      SpO2:  95% 93% 97%  Weight change:   Intake/Output Summary (Last 24 hours) at 12/21/13 1515 Last data filed at 12/21/13 1111  Gross per 24 hour  Intake   1285 ml  Output   2800 ml  Net  -1515 ml    General: Alert, awake, oriented x3, in no acute distress.  HEENT: [place]/AT PEERL, EOMI, very mild icterus OROPHARYNX:  Moist, No exudate/ erythema/lesions.  Heart: Regular rhythm but tachycardic, without murmurs, rubs, gallops.  Lungs: Clear to auscultation, no wheezing or rhonchi noted.  Abdomen: Soft, nontender, nondistended, positive bowel sounds, no masses no hepatosplenomegaly noted.  Neuro: No  focal neurological deficits noted cranial nerves II through XII grossly intact. Strength functional in bilateral upper and lower extremities. Musculoskeletal: No warm swelling or erythema around joints, no spinal tenderness noted. Psychiatric: Patient alert and oriented x3, good insight and cognition, good recent to remote recall.    Data Reviewed: Basic Metabolic Panel:  Recent Labs Lab 12/19/13 0316 12/19/13 1524 12/20/13 0545 12/21/13 0750  NA 141 136* 138 138  K 4.6 4.3 4.2 4.0  CL 103 98 99 103  CO2 24 24 26 25   GLUCOSE 104* 115* 107* 114*  BUN 9 9 11 8   CREATININE 0.59 0.63 0.66 0.56  CALCIUM 9.6 9.7 8.9 8.8   Liver Function Tests:  Recent Labs Lab 12/19/13 0316 12/19/13 1524 12/20/13 0545  AST 39* 47* 43*  ALT 15 29 32  ALKPHOS 82 71 55  BILITOT 6.2* 7.2* 7.8*  PROT 8.5* 8.2 7.4  ALBUMIN 4.8 4.7 3.9   No results for input(s): LIPASE, AMYLASE in the last 168 hours. No results for input(s): AMMONIA in the last 168 hours. CBC:  Recent Labs Lab 12/19/13 0316 12/19/13 1524 12/20/13 0545 12/21/13 0750  WBC 16.6* 18.8* 15.6* 14.8*  NEUTROABS 8.6* 13.9* 13.1* 12.3*  HGB 9.2* 8.8* 7.7* 8.1*  HCT 25.3* 24.6* 20.9* 22.4*  MCV 94.8 96.1 92.9 91.4  PLT 382 274 269 252   Cardiac Enzymes:  Recent Labs Lab 12/19/13 2127  TROPONINI <0.30   BNP (last 3 results) No results for input(s): PROBNP in the last 8760 hours. CBG: No results for input(s): GLUCAP in the last 168 hours.  Recent Results (from the past 240 hour(s))  Culture, blood (routine x 2)     Status: None (Preliminary result)   Collection Time: 12/19/13  9:25 PM  Result Value Ref Range Status   Specimen Description BLOOD LEFT HAND  Final   Special Requests BOTTLES DRAWN AEROBIC ONLY [facility]7CC  Final   Culture  Setup Time   Final    12/20/2013 08:02 Performed at [facility]    Culture   Final           BLOOD CULTURE RECEIVED NO GROWTH TO DATE CULTURE WILL BE HELD FOR 5 DAYS BEFORE ISSUING  A FINAL NEGATIVE REPORT Performed at [facility]    Report Status PENDING  Incomplete  Culture, blood (routine x 2)     Status: None (Preliminary result)   Collection Time: 12/19/13  9:27 PM  Result Value Ref Range Status   Specimen Description BLOOD LEFT ANTECUBITAL  Final   Special Requests BOTTLES DRAWN AEROBIC ONLY [facility]7CC  Final   Culture  Setup Time   Final    12/20/2013 00:55 Performed at [facility]    Culture   Final           BLOOD CULTURE RECEIVED NO GROWTH TO DATE CULTURE WILL BE HELD FOR 5 DAYS BEFORE ISSUING  A FINAL NEGATIVE REPORT Performed at [facility]    Report Status PENDING  Incomplete  Culture, blood (routine x 2)     Status: None (Preliminary result)   Collection Time: 12/20/13  3:35 PM  Result Value Ref Range Status   Specimen Description BLOOD RIGHT HAND  Final   Special Requests BOTTLES DRAWN AEROBIC AND ANAEROBIC 10CC  Final   Culture  Setup Time   Final    12/20/2013 21:18 Performed at [facility]    Culture   Final           BLOOD CULTURE RECEIVED NO GROWTH TO DATE CULTURE WILL BE HELD FOR 5 DAYS BEFORE ISSUING A FINAL NEGATIVE REPORT Performed at [facility]    Report Status PENDING  Incomplete  Culture, blood (routine x 2)     Status: None (Preliminary result)   Collection Time: 12/20/13  3:45 PM  Result Value Ref Range Status   Specimen Description BLOOD RIGHT ARM  Final   Special Requests BOTTLES DRAWN AEROBIC ONLY 5CC  Final   Culture  Setup Time   Final    12/20/2013 21:19 Performed at [facility]    Culture   Final           BLOOD CULTURE RECEIVED NO GROWTH TO DATE CULTURE WILL BE HELD FOR 5 DAYS BEFORE ISSUING A FINAL NEGATIVE REPORT Performed at [facility]    Report Status PENDING  Incomplete     Studies: Dg Chest 2 View  12/19/2013   CLINICAL DATA:  Sickle cell crisis  EXAM: CHEST  2 VIEW  COMPARISON:  12/19/2013 200 hr  FINDINGS: Low lung volumes. Bibasilar  atelectasis. Cardiomegaly. Vascular congestion. No evidence of interstitial edema.  IMPRESSION: Increasing bibasilar atelectasis and vascular congestion without pulmonary edema.   Electronically Signed   By: Maryclare BeanArt  Hoss M.D.   On: 12/19/2013 17:44   Dg Chest 2 View  12/19/2013   CLINICAL DATA:  Chest pain today, history of sickle cell in asthma.  EXAM: CHEST  2 VIEW  COMPARISON:  Chest radiograph October 25, 2012  FINDINGS: The cardiac silhouette appears mildly enlarged. Mediastinal silhouette is nonsuspicious. Mild chronic interstitial changes without pleural effusions or focal consolidations. No pneumothorax. Soft tissue planes and included osseous structures are nonsuspicious. Surgical clips in the included right abdomen likely reflect cholecystectomy.  IMPRESSION: Mild cardiomegaly and chronic interstitial changes. No focal consolidation.   Electronically Signed   By: Awilda Metroourtnay  Bloomer   On: 12/19/2013 02:32   Ct Angio Chest Pe W/cm &/or Wo Cm  12/19/2013   CLINICAL DATA:  Back and chest pain.  Sickle cell crisis.  EXAM: CT ANGIOGRAPHY CHEST WITH CONTRAST  TECHNIQUE: Multidetector CT imaging of the chest was performed using the standard protocol during bolus administration of intravenous contrast. Multiplanar CT image reconstructions and MIPs were obtained to evaluate the vascular anatomy.  CONTRAST:  100mL OMNIPAQUE IOHEXOL 350 MG/ML SOLN  COMPARISON:  07/22/2012  FINDINGS: Chest wall: Unremarkable. Stable osseous changes of sickle cell disease and stable scoliosis.  Mediastinum: Stable mild cardiac enlargement. No pericardial effusion. No mediastinal or hilar mass or adenopathy. The aorta is normal in caliber. No dissection. The pulmonary arteries are mildly enlarged. No definite filling defects to suggest pulmonary embolism but examination is quite limited due to respiratory motion.  Lungs: Limited examination by respiratory motion. There are patchy bilateral infiltrates. No pleural effusion or  pulmonary edema.  Upper abdomen: Small calcified spleen consistent with chronic sickle  cell disease.  Review of the MIP images confirms the above findings.  IMPRESSION: No CT findings for pulmonary embolism.  Bilateral infiltrates.   Electronically Signed   By: Loralie Champagne M.D.   On: 12/19/2013 19:13    Scheduled Meds: . antiseptic oral rinse  7 mL Mouth Rinse q12n4p  . chlorhexidine  15 mL Mouth Rinse BID  . enoxaparin (LOVENOX) injection  40 mg Subcutaneous Q24H  . folic acid  1 mg Oral Daily  . hydroxyurea  1,500 mg Oral Daily  . ketorolac  30 mg Intravenous 4 times per day  . levofloxacin (LEVAQUIN) IV  750 mg Intravenous Q24H  . lisinopril  10 mg Oral Daily  . senna-docusate  1 tablet Oral BID   Continuous Infusions:    Time spent 42 minutes.

## 2013-12-21 NOTE — Progress Notes (Signed)
24 hr PCA  3 mg 3 demands 3 delivered

## 2013-12-22 LAB — TYPE AND SCREEN
ABO/RH(D): B POS
ANTIBODY SCREEN: NEGATIVE
Donor AG Type: NEGATIVE
Unit division: 0

## 2013-12-22 LAB — BASIC METABOLIC PANEL
Anion gap: 13 (ref 5–15)
BUN: 13 mg/dL (ref 6–23)
CO2: 24 meq/L (ref 19–32)
CREATININE: 0.61 mg/dL (ref 0.50–1.35)
Calcium: 9.1 mg/dL (ref 8.4–10.5)
Chloride: 102 mEq/L (ref 96–112)
GFR calc Af Amer: >90 mL/min (ref >90)
GFR calc non Af Amer: >90 mL/min (ref >90)
Glucose, Bld: 106 mg/dL — ABNORMAL HIGH (ref 70–99)
Potassium: 4.3 mEq/L (ref 3.7–5.3)
Sodium: 139 mEq/L (ref 137–147)

## 2013-12-22 LAB — CBC WITH DIFFERENTIAL/PLATELET
Basophils Absolute: 0 10*3/uL (ref 0.0–0.1)
Basophils Relative: 0 % (ref 0–1)
Eosinophils Absolute: 0.3 10*3/uL (ref 0.0–0.7)
Eosinophils Relative: 2 % (ref 0–5)
HCT: 23.6 % — ABNORMAL LOW (ref 39.0–52.0)
Hemoglobin: 8.4 g/dL — ABNORMAL LOW (ref 13.0–17.0)
LYMPHS ABS: 2 10*3/uL (ref 0.7–4.0)
LYMPHS PCT: 13 % (ref 12–46)
MCH: 32.2 pg (ref 26.0–34.0)
MCHC: 35.6 g/dL (ref 30.0–36.0)
MCV: 90.4 fL (ref 78.0–100.0)
MONO ABS: 1.7 10*3/uL — AB (ref 0.1–1.0)
MONOS PCT: 11 % (ref 3–12)
NEUTROS ABS: 11.1 10*3/uL — AB (ref 1.7–7.7)
NEUTROS PCT: 74 % (ref 43–77)
Platelets: 366 10*3/uL (ref 150–400)
RBC: 2.61 MIL/uL — ABNORMAL LOW (ref 4.22–5.81)
RDW: 21.8 % — AB (ref 11.5–15.5)
WBC: 15.1 10*3/uL — ABNORMAL HIGH (ref 4.0–10.5)

## 2013-12-22 LAB — LACTATE DEHYDROGENASE: LDH: 614 U/L — ABNORMAL HIGH (ref 94–250)

## 2013-12-22 MED ORDER — ONDANSETRON HCL 4 MG/2ML IJ SOLN
4.0000 mg | Freq: Four times a day (QID) | INTRAMUSCULAR | Status: DC | PRN
  Administered 2013-12-22 – 2013-12-23 (×2): 4 mg via INTRAVENOUS
  Filled 2013-12-22 (×2): qty 2

## 2013-12-22 NOTE — Progress Notes (Signed)
Subjective: A 23 year old gentleman admitted with sickle cell painful crisis also having acute chest syndrome. Patient is feeling much better today. He has been off IV Dilaudid and is on oral medications. He continues to have tachycardia but otherwise his pain level is decreased. He denied any shortness of breath or cough. No fever. Patient has been getting much better. He has been running temperature in the last couple of days but the MAXIMUM TEMPERATURE in the last 24 hours is 100.62F.  Objective: Vital signs in last 24 hours: Temp:  [98.2 F (36.8 C)-100.6 F (38.1 C)] 98.3 F (36.8 C) (11/19 1016) Pulse Rate:  [108-121] 109 (11/19 1016) Resp:  [20-24] 20 (11/19 1016) BP: (134-142)/(49-69) 142/63 mmHg (11/19 1016) SpO2:  [93 %-99 %] 96 % (11/19 1016) Weight change:  Last BM Date: 12/18/13  Intake/Output from previous day: 11/18 0701 - 11/19 0700 In: 360 [P.O.:360] Out: 1000 [Urine:1000] Intake/Output this shift: Total I/O In: 360 [P.O.:360] Out: -   General appearance: alert, cooperative and no distress Eyes: conjunctivae/corneas clear. PERRL, EOM's intact. Fundi benign. Neck: no adenopathy, no carotid bruit, no JVD, supple, symmetrical, trachea midline and thyroid not enlarged, symmetric, no tenderness/mass/nodules Back: symmetric, no curvature. ROM normal. No CVA tenderness. Resp: clear to auscultation bilaterally Chest wall: no tenderness Cardio: regular rate and rhythm, S1, S2 normal, no murmur, click, rub or gallop GI: soft, non-tender; bowel sounds normal; no masses,  no organomegaly Pulses: 2+ and symmetric Skin: Skin color, texture, turgor normal. No rashes or lesions Neurologic: Grossly normal  Lab Results:  Recent Labs  12/21/13 0750 12/22/13 0520  WBC 14.8* 15.1*  HGB 8.1* 8.4*  HCT 22.4* 23.6*  PLT 252 366   BMET  Recent Labs  12/21/13 0750 12/22/13 0520  NA 138 139  K 4.0 4.3  CL 103 102  CO2 25 24  GLUCOSE 114* 106*  BUN 8 13  CREATININE  0.56 0.61  CALCIUM 8.8 9.1    Studies/Results: No results found.  Medications: I have reviewed the patient's current medications.  Assessment/Plan: A 23 yo man with sickle cell crisis. Patient also has acute chest syndrome.  #1 sickle cell painful crisis: Patient is much better. He is on scheduled OxyContin right now and pain is down to 5 out of 10. We will keep him on oral medicine pending resolution of his other medical problems.  #2 acute chest syndrome: This also seems to have resolved. Patient still has tachycardia and low-grade temperature but much improved. We'll continue with the Levaquin today would be IV and then switched to oral in the morning.  #3 leukocytosis: Patient's white count is still elevated probably a combination of acute chest syndrome and VOC. Continue to monitor  #4 sickle cell anemia: Hemoglobin seems very much stable. Continue to monitor.  #5 sinus tachycardia: Patient is however it is now around 101-110. He is much improved. I will keep him at least another day and continue to hydrate him until his heart rate is better.    LOS: 3 days   GARBA,LAWAL 12/22/2013, 10:19 AM

## 2013-12-23 LAB — COMPREHENSIVE METABOLIC PANEL
ALK PHOS: 64 U/L (ref 39–117)
ALT: 45 U/L (ref 0–53)
AST: 50 U/L — ABNORMAL HIGH (ref 0–37)
Albumin: 3.3 g/dL — ABNORMAL LOW (ref 3.5–5.2)
Anion gap: 11 (ref 5–15)
BUN: 10 mg/dL (ref 6–23)
CALCIUM: 8.6 mg/dL (ref 8.4–10.5)
CO2: 27 mEq/L (ref 19–32)
Chloride: 101 mEq/L (ref 96–112)
Creatinine, Ser: 0.64 mg/dL (ref 0.50–1.35)
GFR calc Af Amer: >90 mL/min (ref >90)
GFR calc non Af Amer: >90 mL/min (ref >90)
Glucose, Bld: 105 mg/dL — ABNORMAL HIGH (ref 70–99)
POTASSIUM: 4.1 meq/L (ref 3.7–5.3)
Sodium: 139 mEq/L (ref 137–147)
TOTAL PROTEIN: 6.7 g/dL (ref 6.0–8.3)
Total Bilirubin: 6 mg/dL — ABNORMAL HIGH (ref 0.3–1.2)

## 2013-12-23 LAB — CBC WITH DIFFERENTIAL/PLATELET
BASOS PCT: 0 % (ref 0–1)
Basophils Absolute: 0 10*3/uL (ref 0.0–0.1)
Eosinophils Absolute: 0.4 10*3/uL (ref 0.0–0.7)
Eosinophils Relative: 3 % (ref 0–5)
HEMATOCRIT: 20 % — AB (ref 39.0–52.0)
HEMOGLOBIN: 7.1 g/dL — AB (ref 13.0–17.0)
Lymphocytes Relative: 13 % (ref 12–46)
Lymphs Abs: 1.7 10*3/uL (ref 0.7–4.0)
MCH: 31.7 pg (ref 26.0–34.0)
MCHC: 35.5 g/dL (ref 30.0–36.0)
MCV: 89.3 fL (ref 78.0–100.0)
MONO ABS: 1.6 10*3/uL — AB (ref 0.1–1.0)
Monocytes Relative: 12 % (ref 3–12)
NEUTROS ABS: 9.5 10*3/uL — AB (ref 1.7–7.7)
Neutrophils Relative %: 72 % (ref 43–77)
Platelets: 377 10*3/uL (ref 150–400)
RBC: 2.24 MIL/uL — ABNORMAL LOW (ref 4.22–5.81)
RDW: 22 % — ABNORMAL HIGH (ref 11.5–15.5)
WBC: 13.2 10*3/uL — ABNORMAL HIGH (ref 4.0–10.5)

## 2013-12-23 MED ORDER — LEVOFLOXACIN 750 MG PO TABS
750.0000 mg | ORAL_TABLET | Freq: Every day | ORAL | Status: DC
  Administered 2013-12-23: 750 mg via ORAL
  Filled 2013-12-23 (×2): qty 1

## 2013-12-23 MED ORDER — OXYCODONE HCL 5 MG PO TABS: 15.0000 mg | ORAL_TABLET | ORAL | Status: DC | PRN

## 2013-12-23 NOTE — Progress Notes (Signed)
ANTIBIOTIC CONSULT NOTE - Follow Up  Pharmacy Consult for Levaquin Indication: pneumonia  No Known Allergies  Patient Measurements: Height: 5\' 5"  (165.1 cm) Weight: 143 lb 11.8 oz (65.2 kg) IBW/kg (Calculated) : 61.5  Vital Signs: Temp: 98.3 F (36.8 C) (11/20 1004) Temp Source: Oral (11/20 1004) BP: 129/54 mmHg (11/20 1004) Pulse Rate: 82 (11/20 1004) Intake/Output from previous day: 11/19 0701 - 11/20 0700 In: 480 [P.O.:480] Out: -  Intake/Output from this shift: Total I/O In: 360 [P.O.:360] Out: -   Labs:  Recent Labs  12/21/13 0750 12/22/13 0520 12/23/13 0435  WBC 14.8* 15.1* 13.2*  HGB 8.1* 8.4* 7.1*  PLT 252 366 377  CREATININE 0.56 0.61 0.64   Estimated Creatinine Clearance: 124.9 mL/min (by C-G formula based on Cr of 0.64). No results for input(s): VANCOTROUGH, VANCOPEAK, VANCORANDOM, GENTTROUGH, GENTPEAK, GENTRANDOM, TOBRATROUGH, TOBRAPEAK, TOBRARND, AMIKACINPEAK, AMIKACINTROU, AMIKACIN in the last 72 hours.   Microbiology: Recent Results (from the past 720 hour(s))  Culture, blood (routine x 2)     Status: None (Preliminary result)   Collection Time: 12/19/13  9:25 PM  Result Value Ref Range Status   Specimen Description BLOOD LEFT HAND  Final   Special Requests BOTTLES DRAWN AEROBIC ONLY [facility]7CC  Final   Culture  Setup Time   Final    12/20/2013 08:02 Performed at [facility]    Culture   Final           BLOOD CULTURE RECEIVED NO GROWTH TO DATE CULTURE WILL BE HELD FOR 5 DAYS BEFORE ISSUING A FINAL NEGATIVE REPORT Performed at [facility]    Report Status PENDING  Incomplete  Culture, blood (routine x 2)     Status: None (Preliminary result)   Collection Time: 12/19/13  9:27 PM  Result Value Ref Range Status   Specimen Description BLOOD LEFT ANTECUBITAL  Final   Special Requests BOTTLES DRAWN AEROBIC ONLY [facility]7CC  Final   Culture  Setup Time   Final    12/20/2013 00:55 Performed at [facility]    Culture   Final     BLOOD CULTURE RECEIVED NO GROWTH TO DATE CULTURE WILL BE HELD FOR 5 DAYS BEFORE ISSUING A FINAL NEGATIVE REPORT Performed at [facility]    Report Status PENDING  Incomplete  Culture, blood (routine x 2)     Status: None (Preliminary result)   Collection Time: 12/20/13  3:35 PM  Result Value Ref Range Status   Specimen Description BLOOD RIGHT HAND  Final   Special Requests BOTTLES DRAWN AEROBIC AND ANAEROBIC 10CC  Final   Culture  Setup Time   Final    12/20/2013 21:18 Performed at [facility]    Culture   Final           BLOOD CULTURE RECEIVED NO GROWTH TO DATE CULTURE WILL BE HELD FOR 5 DAYS BEFORE ISSUING A FINAL NEGATIVE REPORT Performed at [facility]    Report Status PENDING  Incomplete  Culture, blood (routine x 2)     Status: None (Preliminary result)   Collection Time: 12/20/13  3:45 PM  Result Value Ref Range Status   Specimen Description BLOOD RIGHT ARM  Final   Special Requests BOTTLES DRAWN AEROBIC ONLY 5CC  Final   Culture  Setup Time   Final    12/20/2013 21:19 Performed at [facility]    Culture   Final           BLOOD CULTURE RECEIVED NO  GROWTH TO DATE CULTURE WILL BE HELD FOR 5 DAYS BEFORE ISSUING A FINAL NEGATIVE REPORT Performed at [facility]    Report Status PENDING  Incomplete    Medical History: Past Medical History  Diagnosis Date  . Sickle cell anemia   . Hypertension   . Asthma     Medications:  Scheduled:  . antiseptic oral rinse  7 mL Mouth Rinse q12n4p  . chlorhexidine  15 mL Mouth Rinse BID  . enoxaparin (LOVENOX) injection  40 mg Subcutaneous Q24H  . folic acid  1 mg Oral Daily  . hydroxyurea  1,500 mg Oral Daily  . ketorolac  30 mg Intravenous 4 times per day  . levofloxacin  750 mg Oral QHS  . lisinopril  10 mg Oral Daily  . oxyCODONE  15 mg Oral Q4H  . senna-docusate  1 tablet Oral BID   Infusions:    PRN: acetaminophen, albuterol, diphenhydrAMINE, HYDROmorphone (DILAUDID)  injection, ondansetron (ZOFRAN) IV, polyethylene glycol  Assessment: 23 yo male with asthma and sickle cell disease presents with pain crisis. CT angiogram shows bibasilar infiltrates concerning for acute chest syndrome. Pharmacy consulted to dose levaquin.  11/16 >> Levaquin >>  Tmax: 102.9 this AM WBC: 13.2, down Renal: SCr 0.64, stable. CrCl > 100 ml/min  11/16 blood x 2: ngtd 11/17 blood x 2: ngtd  Goal of Therapy:  Eradication of infection Dose appropriate for indication/renal function  Plan:   Continue Levaquin 750mg  IV q24h (Day 5/8 per notes) Follow up renal function & cultures Follow up transition to PO  Hessie KnowsJustin M Leara Rawl, PharmD, BCPS Pager [phone] 12/23/2013 10:23 AM

## 2013-12-23 NOTE — Progress Notes (Signed)
Patient ID: Cameron [name], male   DOB: Jul 16, 1990, 23 y.o.   MRN: [id number] SICKLE CELL SERVICE PROGRESS NOTE  Cameron [name] MVH:[id number] DOB: Jul 16, 1990 DOA: 12/19/2013 PCP: No PCP Per Patient   Presenting HPI: Cameron [name] is a 23 y.o. male with history of sickle cell disease, hypertension and asthma results to ER because of ongoing low back pain and chest pain over the last 2 days. Patient had originally come to the ER and was discharged home after treating him for pain but had to come by because of worsening pain. Patient also has been experiencing some chest pain bilaterally in the lower part of the chest. Denies any associated cough or shortness of breath. Since patient was found to be tachycardic patient had a CT angiography of the chest which was negative for PE but was showing bilateral infiltrates. Patient has been admitted for further management for sickle cell pain crisis with possible acute chest syndrome. Patient was also found to be febrile. Patient had mild nausea but denies any vomiting or abdominal pain or any dysuria. Patient denies any headache visual symptoms or any focal deficits. Patient states he has not taken his hydroxyurea for last 2 weeks because she had ran out of it.   Consultants:  None  Procedures:  none  Antibiotics:  Levaquin 11/16 >>  HPI/Subjective: Pt states that he feels better. He has occassional minimal pain in his chest but that has improved. In addition, he reports vomiting twice this morning before breakfast. He has not vomited since breakfast. He had a recorded fever last night but states that his room was really hot at the time it was taken but he didn't feel feverish. Since room has been cooled he has not had any.  Objective: Filed Vitals:   12/23/13 0200 12/23/13 0312 12/23/13 0532 12/23/13 1004  BP: 141/67  112/52 129/54  Pulse: 102  98 82  Temp: 102.9 F (39.4 C) 101 F (38.3 C) 97.8 F (36.6 C) 98.3 F (36.8 C)  TempSrc: Oral  Oral Oral Oral  Resp: 22  20 18   Height:      Weight:      SpO2: 93%  97% 100%   Weight change:   Intake/Output Summary (Last 24 hours) at 12/23/13 1111 Last data filed at 12/23/13 [id number]  Gross per 24 hour  Intake    480 ml  Output      0 ml  Net    480 ml    General: Alert, awake, oriented x3, in no acute distress.  HEENT: [place]/AT PEERL, EOMI Neck: Trachea midline,  no masses, no thyromegal,y no JVD, no carotid bruit OROPHARYNX:  Moist, No exudate/ erythema/lesions.  Heart: Regular rate and rhythm, without murmurs, rubs, gallops Lungs: Clear to auscultation, no wheezing or rhonchi noted. No increased vocal fremitus resonant to percussion  Abdomen: Soft, nontender, nondistended, positive bowel sounds, no masses no hepatosplenomegaly noted..  Neuro: No focal neurological deficits noted cranial nerves II through XII grossly intact. Strength 5 out of 5 in bilateral upper and lower extremities. Musculoskeletal: No warm swelling or erythema around joints, no spinal tenderness noted.   Data Reviewed: Basic Metabolic Panel:  Recent Labs Lab 12/19/13 1524 12/20/13 0545 12/21/13 0750 12/22/13 0520 12/23/13 0435  NA 136* 138 138 139 139  K 4.3 4.2 4.0 4.3 4.1  CL 98 99 103 102 101  CO2 24 26 25 24 27   GLUCOSE 115* 107* 114* 106* 105*  BUN 9 11 8 13 10   CREATININE 0.63 0.66  0.56 0.61 0.64  CALCIUM 9.7 8.9 8.8 9.1 8.6   Liver Function Tests:  Recent Labs Lab 12/19/13 0316 12/19/13 1524 12/20/13 0545 12/23/13 0435  AST 39* 47* 43* 50*  ALT 15 29 32 45  ALKPHOS 82 71 55 64  BILITOT 6.2* 7.2* 7.8* 6.0*  PROT 8.5* 8.2 7.4 6.7  ALBUMIN 4.8 4.7 3.9 3.3*   No results for input(s): LIPASE, AMYLASE in the last 168 hours. No results for input(s): AMMONIA in the last 168 hours. CBC:  Recent Labs Lab 12/19/13 1524 12/20/13 0545 12/21/13 0750 12/22/13 0520 12/23/13 0435  WBC 18.8* 15.6* 14.8* 15.1* 13.2*  NEUTROABS 13.9* 13.1* 12.3* 11.1* 9.5*  HGB 8.8* 7.7* 8.1* 8.4*  7.1*  HCT 24.6* 20.9* 22.4* 23.6* 20.0*  MCV 96.1 92.9 91.4 90.4 89.3  PLT 274 269 252 366 377   Cardiac Enzymes:  Recent Labs Lab 12/19/13 2127  TROPONINI <0.30   BNP (last 3 results) No results for input(s): PROBNP in the last 8760 hours. CBG: No results for input(s): GLUCAP in the last 168 hours.  Recent Results (from the past 240 hour(s))  Culture, blood (routine x 2)     Status: None (Preliminary result)   Collection Time: 12/19/13  9:25 PM  Result Value Ref Range Status   Specimen Description BLOOD LEFT HAND  Final   Special Requests BOTTLES DRAWN AEROBIC ONLY [facility]  Final   Culture  Setup Time   Final    12/20/2013 08:02 Performed at [facility]    Culture   Final           BLOOD CULTURE RECEIVED NO GROWTH TO DATE CULTURE WILL BE HELD FOR 5 DAYS BEFORE ISSUING A FINAL NEGATIVE REPORT Performed at [facility]    Report Status PENDING  Incomplete  Culture, blood (routine x 2)     Status: None (Preliminary result)   Collection Time: 12/19/13  9:27 PM  Result Value Ref Range Status   Specimen Description BLOOD LEFT ANTECUBITAL  Final   Special Requests BOTTLES DRAWN AEROBIC ONLY [facility]  Final   Culture  Setup Time   Final    12/20/2013 00:55 Performed at [facility]    Culture   Final           BLOOD CULTURE RECEIVED NO GROWTH TO DATE CULTURE WILL BE HELD FOR 5 DAYS BEFORE ISSUING A FINAL NEGATIVE REPORT Performed at [facility]    Report Status PENDING  Incomplete  Culture, blood (routine x 2)     Status: None (Preliminary result)   Collection Time: 12/20/13  3:35 PM  Result Value Ref Range Status   Specimen Description BLOOD RIGHT HAND  Final   Special Requests BOTTLES DRAWN AEROBIC AND ANAEROBIC 10CC  Final   Culture  Setup Time   Final    12/20/2013 21:18 Performed at [facility]    Culture   Final           BLOOD CULTURE RECEIVED NO GROWTH TO DATE CULTURE WILL BE HELD FOR 5 DAYS BEFORE ISSUING A FINAL NEGATIVE  REPORT Performed at [facility]    Report Status PENDING  Incomplete  Culture, blood (routine x 2)     Status: None (Preliminary result)   Collection Time: 12/20/13  3:45 PM  Result Value Ref Range Status   Specimen Description BLOOD RIGHT ARM  Final   Special Requests BOTTLES DRAWN AEROBIC ONLY 5CC  Final   Culture  Setup Time  Final    12/20/2013 21:19 Performed at [facility]    Culture   Final           BLOOD CULTURE RECEIVED NO GROWTH TO DATE CULTURE WILL BE HELD FOR 5 DAYS BEFORE ISSUING A FINAL NEGATIVE REPORT Performed at [facility]    Report Status PENDING  Incomplete     Studies: Dg Chest 2 View  12/19/2013   CLINICAL DATA:  Sickle cell crisis  EXAM: CHEST  2 VIEW  COMPARISON:  12/19/2013 200 hr  FINDINGS: Low lung volumes. Bibasilar atelectasis. Cardiomegaly. Vascular congestion. No evidence of interstitial edema.  IMPRESSION: Increasing bibasilar atelectasis and vascular congestion without pulmonary edema.   Electronically Signed   By: Maryclare Bean M.D.   On: 12/19/2013 17:44   Dg Chest 2 View  12/19/2013   CLINICAL DATA:  Chest pain today, history of sickle cell in asthma.  EXAM: CHEST  2 VIEW  COMPARISON:  Chest radiograph October 25, 2012  FINDINGS: The cardiac silhouette appears mildly enlarged. Mediastinal silhouette is nonsuspicious. Mild chronic interstitial changes without pleural effusions or focal consolidations. No pneumothorax. Soft tissue planes and included osseous structures are nonsuspicious. Surgical clips in the included right abdomen likely reflect cholecystectomy.  IMPRESSION: Mild cardiomegaly and chronic interstitial changes. No focal consolidation.   Electronically Signed   By: Awilda Metro   On: 12/19/2013 02:32   Ct Angio Chest Pe W/cm &/or Wo Cm  12/19/2013   CLINICAL DATA:  Back and chest pain.  Sickle cell crisis.  EXAM: CT ANGIOGRAPHY CHEST WITH CONTRAST  TECHNIQUE: Multidetector CT imaging of the chest was  performed using the standard protocol during bolus administration of intravenous contrast. Multiplanar CT image reconstructions and MIPs were obtained to evaluate the vascular anatomy.  CONTRAST:  OMNIPAQUE IOHEXOL 350 MG/ML SOLN  COMPARISON:  07/22/2012  FINDINGS: Chest wall: Unremarkable. Stable osseous changes of sickle cell disease and stable scoliosis.  Mediastinum: Stable mild cardiac enlargement. No pericardial effusion. No mediastinal or hilar mass or adenopathy. The aorta is normal in caliber. No dissection. The pulmonary arteries are mildly enlarged. No definite filling defects to suggest pulmonary embolism but examination is quite limited due to respiratory motion.  Lungs: Limited examination by respiratory motion. There are patchy bilateral infiltrates. No pleural effusion or pulmonary edema.  Upper abdomen: Small calcified spleen consistent with chronic sickle cell disease.  Review of the MIP images confirms the above findings.  IMPRESSION: No CT findings for pulmonary embolism.  Bilateral infiltrates.   Electronically Signed   By: Loralie Champagne M.D.   On: 12/19/2013 19:13    Scheduled Meds: . antiseptic oral rinse  7 mL Mouth Rinse q12n4p  . chlorhexidine  15 mL Mouth Rinse BID  . enoxaparin (LOVENOX) injection  40 mg Subcutaneous Q24H  . folic acid  1 mg Oral Daily  . hydroxyurea  1,500 mg Oral Daily  . ketorolac  30 mg Intravenous 4 times per day  . levofloxacin  750 mg Oral QHS  . lisinopril  10 mg Oral Daily  . senna-docusate  1 tablet Oral BID   Continuous Infusions:   Principal Problem:   Sickle cell crisis acute chest syndrome Active Problems:   Hypertension   Asthma, chronic   Sickle cell pain crisis   Tachycardia   Fever   Hypoxemia   Assessment/Plan: Principal Problem:   Sickle cell crisis acute chest syndrome Active Problems:   Hypertension   Asthma, chronic   Sickle cell pain  crisis   Tachycardia   Fever   Hypoxemia  1. Pneumonia/CAP: Clinically  improving. Will stop O2 therapy and monitor saturations. Afebrile for over 48 hours as likely temp spike recorded overnight was due to hot room. Will switch from IV Levaquin to PO. Levaquin day 5/8. 2. Sickle cell Crisis with Acute Chest Syndrome: Antibiotic treatment as above. Minimal pain with no IV Dilaudid use, will make Oxycodone PRN. Continue Toradol. 3. Vomiting: Likely due to frequent opioid use. Will make Oxycodone PRN and monitor.  4. Anemia: Hgb down to 7.1, baseline is 8-9. Transfused 1 unit during hospitalization. Due to improvement, will monitor for now.  5. Leukocytosis: Trending down with clinical improvement. Now 13.2, will monitor. 6. Sickle cell care: Continue Hydrea and folic acied   7. FEN/GI : Regular Diet  No IV fluids Bowel regimen in place  Code Status: full DVT Prophylaxis: enoxaparin Disposition Plan: If he remains afebrile, Hgb stable, and he tolerates PO  Serina CowperAGBOOLA, Avni Traore  Pager [phone]. If 7PM-7AM, please contact night-coverage.  12/23/2013, 11:11 AM  LOS: 4 days   Serina CowperAGBOOLA, Deatra Mcmahen

## 2013-12-23 NOTE — Progress Notes (Signed)
CARE MANAGEMENT NOTE 12/23/2013  Patient:  Leonette NuttingGREEN,Kollyn   Account Number:  [id number]  Date Initiated:  12/23/2013  Documentation initiated by:  Ferdinand CavaSCHETTINO,Arrabella Westerman  Subjective/Objective Assessment:   23 yo male admitted with SCC, [facility] [employer] [place] and continues to [facility]     Action/Plan:   discharge planning   Anticipated DC Date:  12/25/2013   Anticipated DC Plan:  HOME/SELF CARE         Choice offered to / List presented to:             Status of service:  Completed, signed off Medicare Important Message given?   (If response is "NO", the following Medicare IM given date fields will be blank) Date Medicare IM given:   Medicare IM given by:   Date Additional Medicare IM given:   Additional Medicare IM given by:    Discharge Disposition:  HOME/SELF CARE  Per UR Regulation:    If discussed at Long Length of Stay Meetings, dates discussed:    Comments:  12/23/13 Ferdinand CavaAndrea Schettino RN, BSN, CM [phone] No orders, no CM needs identified

## 2013-12-24 LAB — CBC WITH DIFFERENTIAL/PLATELET
BASOS ABS: 0 10*3/uL (ref 0.0–0.1)
BASOS PCT: 0 % (ref 0–1)
Eosinophils Absolute: 0.5 10*3/uL (ref 0.0–0.7)
Eosinophils Relative: 4 % (ref 0–5)
HCT: 21 % — ABNORMAL LOW (ref 39.0–52.0)
HEMOGLOBIN: 7.4 g/dL — AB (ref 13.0–17.0)
LYMPHS PCT: 26 % (ref 12–46)
Lymphs Abs: 3.1 10*3/uL (ref 0.7–4.0)
MCH: 32.5 pg (ref 26.0–34.0)
MCHC: 35.2 g/dL (ref 30.0–36.0)
MCV: 92.1 fL (ref 78.0–100.0)
Monocytes Absolute: 1.6 10*3/uL — ABNORMAL HIGH (ref 0.1–1.0)
Monocytes Relative: 13 % — ABNORMAL HIGH (ref 3–12)
Neutro Abs: 6.8 10*3/uL (ref 1.7–7.7)
Neutrophils Relative %: 57 % (ref 43–77)
Platelets: 429 10*3/uL — ABNORMAL HIGH (ref 150–400)
RBC: 2.28 MIL/uL — ABNORMAL LOW (ref 4.22–5.81)
RDW: 21.9 % — ABNORMAL HIGH (ref 11.5–15.5)
WBC: 12 10*3/uL — ABNORMAL HIGH (ref 4.0–10.5)

## 2013-12-24 LAB — RETICULOCYTES
RBC.: 2.28 MIL/uL — ABNORMAL LOW (ref 4.22–5.81)
Retic Count, Absolute: 275.9 10*3/uL — ABNORMAL HIGH (ref 19.0–186.0)
Retic Ct Pct: 12.1 % — ABNORMAL HIGH (ref 0.4–3.1)

## 2013-12-24 LAB — LACTATE DEHYDROGENASE: LDH: 518 U/L — ABNORMAL HIGH (ref 94–250)

## 2013-12-24 MED ORDER — LEVOFLOXACIN 750 MG PO TABS: 750.0000 mg | ORAL_TABLET | Freq: Every day | ORAL | Status: AC

## 2013-12-24 NOTE — Progress Notes (Signed)
Pt discharged home. DC instructions provided and explained. Meds reviewed. Rx called into pharmacy. Pt denies pain at present. No complaints. Pt stable at discharge

## 2013-12-24 NOTE — Discharge Summary (Signed)
Sickle-Cell Discharge Summary  Corin Tilly ZOX:[id number] DOB: [birth year] DOA: 12/19/2013  PCP: No PCP Per Patient  Admit date: 12/19/2013 Discharge date: 12/24/2013  Discharge Diagnoses:  Principal Problem:   Sickle cell crisis acute chest syndrome Active Problems:   Hypertension   Asthma, chronic   Sickle cell pain crisis   Tachycardia   Fever   Hypoxemia   Discharge Condition:improved/stable  Disposition: home Follow-up Information    Follow up with No PCP Per Patient.   Specialty:  General Practice      Follow up with [facility], [facility] RAMESH, MD In 1 week.   Specialties:  Hematology, Oncology   Why:  As needed   Contact information:   [address] [facility] [place] [address] [phone]       Follow up with [facility].   Specialty:  Emergency Medicine   Why:  As needed, If symptoms worsen   Contact information:   [address] [id number] mc [place] [address] [phone]      Diet:regular  Wt Readings from Last 3 Encounters:  12/19/13 143 lb 11.8 oz (65.2 kg)  11/17/12 132 lb (59.875 kg)  10/26/12 130 lb 12.6 oz (59.324 kg)    History of present illness:  Cameron [name] is a 23 y.o. male with history of sickle cell disease, hypertension and asthma results to ER because of ongoing low back pain and chest pain over the last 2 days. Patient had originally come to the ER and was discharged home after treating him for pain but had to come by because of worsening pain. Patient also has been experiencing some chest pain bilaterally in the lower part of the chest. Denies any associated cough or shortness of breath. Since patient was found to be tachycardic patient had a CT angiography of the chest which was negative for PE but was showing bilateral infiltrates. Patient has been admitted for further management for sickle cell pain crisis with possible acute chest syndrome. Patient was also found to  be febrile. Patient had mild nausea but denies any vomiting or abdominal pain or any dysuria. Patient denies any headache visual symptoms or any focal deficits. Patient states he has not taken his hydroxyurea for last 2 weeks because she had ran out of it.   Hospital Course:  Sickle Cell Crisis: Patient presented with pain characteristic of acute vaso-occlusive crisis. Likely aggravated by recent air travel and dehydration. Pt's pain was treated with bolus IV analgesics initially and was later transitioned with a Dilaudid PCA and ketorolac. PCA was decreased as pain improved and he was started on his home dose of Oxycodone. His pain was well controlled with his oral home regimen. He had overall improvement of his pain and was physically functional upon discharge.   Pneumonia/CAP/Acute chest: Pt presented with chest pain and fever, and was found to have bibasilar opacities on CT scan. He was started on IV Levaquin. He improved clinically and his IV Levaquin was switched to PO. Pt completed 5 of 8 days and was sent home with a prescription to complete 8 day course.  Anemia: Pt had active hemolysis with cocurrent crisis. His Hgb dropped to a nadir of 7.1. His LDH peaked at 675 (baseline ~300). He received a total of 1 units of PRBC during stay for acute chest syndrome.  Discharge Exam: Filed Vitals:   12/24/13 0525  BP: 140/62  Pulse: 86  Temp: 98.4 F (36.9 C)  Resp: 18   Filed Vitals:  12/23/13 1850 12/23/13 2133 12/24/13 0222 12/24/13 0525  BP: 130/48 154/72 137/59 140/62  Pulse: 102 121 90 86  Temp: 99.9 F (37.7 C) 100.3 F (37.9 C) 98.2 F (36.8 C) 98.4 F (36.9 C)  TempSrc: Oral Oral Oral Oral  Resp: 18 18 18 18   Height:      Weight:      SpO2: 96% 97% 99% 100%   General: Alert, awake, oriented x3, in no acute distress.  HEENT: [place]/AT PEERL, EOMI Neck: Trachea midline, no masses, no thyromegal,y no JVD, no carotid bruit OROPHARYNX: Moist, No exudate/ erythema/lesions.   Heart: Regular rate and rhythm, without murmurs, rubs, gallops Lungs: Clear to auscultation, no wheezing or rhonchi noted. No increased vocal fremitus resonant to percussion  Abdomen: Soft, nontender, nondistended, positive bowel sounds, no masses no hepatosplenomegaly noted..  Neuro: No focal neurological deficits noted cranial nerves II through XII grossly intact. Strength 5 out of 5 in bilateral upper and lower extremities. Musculoskeletal: No warm swelling or erythema around joints, no spinal tenderness noted.  Discharge Instructions Continue Levaquin for 3 more days Go to ED if feeling SOB, weak, or if any symptoms worsen.    Medication List    TAKE these medications        albuterol 108 (90 BASE) MCG/ACT inhaler  Commonly known as:  PROVENTIL HFA;VENTOLIN HFA  Inhale 2 puffs into the lungs every 6 (six) hours as needed. For shortness of breath.     FLINSTONES GUMMIES OMEGA-3 DHA Chew  Chew 1 tablet by mouth daily.     folic acid 1 MG tablet  Commonly known as:  FOLVITE  Take 1 mg by mouth daily.     hydroxyurea 500 MG capsule  Commonly known as:  HYDREA  Take 1,500 mg by mouth daily. May take with food to minimize GI side effects.     levofloxacin 750 MG tablet  Commonly known as:  LEVAQUIN  Take 1 tablet (750 mg total) by mouth at bedtime.     lisinopril 10 MG tablet  Commonly known as:  PRINIVIL,ZESTRIL  Take 10 mg by mouth daily.     oxyCODONE 5 MG immediate release tablet  Commonly known as:  ROXICODONE  Take 3 tablets (15 mg total) by mouth every 4 (four) hours as needed for pain. Take 1-2 tablets every 4-6 hours as needed for pain control     oxyCODONE-acetaminophen 10-325 MG per tablet  Commonly known as:  PERCOCET  Take 1 tablet by mouth every 4 (four) hours as needed for pain.         The results of significant diagnostics from this hospitalization (including imaging, microbiology, ancillary and laboratory) are listed below for reference.     Significant Diagnostic Studies: Dg Chest 2 View  12/19/2013   CLINICAL DATA:  Sickle cell crisis  EXAM: CHEST  2 VIEW  COMPARISON:  12/19/2013 200 hr  FINDINGS: Low lung volumes. Bibasilar atelectasis. Cardiomegaly. Vascular congestion. No evidence of interstitial edema.  IMPRESSION: Increasing bibasilar atelectasis and vascular congestion without pulmonary edema.   Electronically Signed   By: Maryclare BeanArt  Hoss M.D.   On: 12/19/2013 17:44   Dg Chest 2 View  12/19/2013   CLINICAL DATA:  Chest pain today, history of sickle cell in asthma.  EXAM: CHEST  2 VIEW  COMPARISON:  Chest radiograph October 25, 2012  FINDINGS: The cardiac silhouette appears mildly enlarged. Mediastinal silhouette is nonsuspicious. Mild chronic interstitial changes without pleural effusions or focal consolidations. No pneumothorax. Soft tissue planes  and included osseous structures are nonsuspicious. Surgical clips in the included right abdomen likely reflect cholecystectomy.  IMPRESSION: Mild cardiomegaly and chronic interstitial changes. No focal consolidation.   Electronically Signed   By: Awilda Metroourtnay  Bloomer   On: 12/19/2013 02:32   Ct Angio Chest Pe W/cm &/or Wo Cm  12/19/2013   CLINICAL DATA:  Back and chest pain.  Sickle cell crisis.  EXAM: CT ANGIOGRAPHY CHEST WITH CONTRAST  TECHNIQUE: Multidetector CT imaging of the chest was performed using the standard protocol during bolus administration of intravenous contrast. Multiplanar CT image reconstructions and MIPs were obtained to evaluate the vascular anatomy.  CONTRAST:  100mL OMNIPAQUE IOHEXOL 350 MG/ML SOLN  COMPARISON:  07/22/2012  FINDINGS: Chest wall: Unremarkable. Stable osseous changes of sickle cell disease and stable scoliosis.  Mediastinum: Stable mild cardiac enlargement. No pericardial effusion. No mediastinal or hilar mass or adenopathy. The aorta is normal in caliber. No dissection. The pulmonary arteries are mildly enlarged. No definite filling defects to suggest  pulmonary embolism but examination is quite limited due to respiratory motion.  Lungs: Limited examination by respiratory motion. There are patchy bilateral infiltrates. No pleural effusion or pulmonary edema.  Upper abdomen: Small calcified spleen consistent with chronic sickle cell disease.  Review of the MIP images confirms the above findings.  IMPRESSION: No CT findings for pulmonary embolism.  Bilateral infiltrates.   Electronically Signed   By: Loralie ChampagneMark  Gallerani M.D.   On: 12/19/2013 19:13    Microbiology: Recent Results (from the past 240 hour(s))  Culture, blood (routine x 2)     Status: None (Preliminary result)   Collection Time: 12/19/13  9:25 PM  Result Value Ref Range Status   Specimen Description BLOOD LEFT HAND  Final   Special Requests BOTTLES DRAWN AEROBIC ONLY [facility]7CC  Final   Culture  Setup Time   Final    12/20/2013 08:02 [facility]formed at [facility]    Culture   Final           BLOOD CULTURE RECEIVED NO GROWTH TO DATE CULTURE WILL BE HELD FOR 5 DAYS BEFORE ISSUING A FINAL NEGATIVE REPORT Performed at [facility]    Report Status PENDING  Incomplete  Culture, blood (routine x 2)     Status: None (Preliminary result)   Collection Time: 12/19/13  9:27 PM  Result Value Ref Range Status   Specimen Description BLOOD LEFT ANTECUBITAL  Final   Special Requests BOTTLES DRAWN AEROBIC ONLY [facility]7CC  Final   Culture  Setup Time   Final    12/20/2013 00:55 Performed at [facility]    Culture   Final           BLOOD CULTURE RECEIVED NO GROWTH TO DATE CULTURE WILL BE HELD FOR 5 DAYS BEFORE ISSUING A FINAL NEGATIVE REPORT Performed at [facility]    Report Status PENDING  Incomplete  Culture, blood (routine x 2)     Status: None (Preliminary result)   Collection Time: 12/20/13  3:35 PM  Result Value Ref Range Status   Specimen Description BLOOD RIGHT HAND  Final   Special Requests BOTTLES DRAWN AEROBIC AND ANAEROBIC 10CC  Final   Culture  Setup Time    Final    12/20/2013 21:18 Performed at [facility]    Culture   Final           BLOOD CULTURE RECEIVED NO GROWTH TO DATE CULTURE WILL BE HELD FOR 5 DAYS BEFORE ISSUING A FINAL NEGATIVE  REPORT Performed at [facility]    Report Status PENDING  Incomplete  Culture, blood (routine x 2)     Status: None (Preliminary result)   Collection Time: 12/20/13  3:45 PM  Result Value Ref Range Status   Specimen Description BLOOD RIGHT ARM  Final   Special Requests BOTTLES DRAWN AEROBIC ONLY 5CC  Final   Culture  Setup Time   Final    12/20/2013 21:19 Performed at [facility]    Culture   Final           BLOOD CULTURE RECEIVED NO GROWTH TO DATE CULTURE WILL BE HELD FOR 5 DAYS BEFORE ISSUING A FINAL NEGATIVE REPORT Performed at [facility]    Report Status PENDING  Incomplete     Labs: Basic Metabolic Panel:  Recent Labs Lab 12/19/13 1524 12/20/13 0545 12/21/13 0750 12/22/13 0520 12/23/13 0435  NA 136* 138 138 139 139  K 4.3 4.2 4.0 4.3 4.1  CL 98 99 103 102 101  CO2 24 26 25 24 27   GLUCOSE 115* 107* 114* 106* 105*  BUN 9 11 8 13 10   CREATININE 0.63 0.66 0.56 0.61 0.64  CALCIUM 9.7 8.9 8.8 9.1 8.6   Liver Function Tests:  Recent Labs Lab 12/19/13 0316 12/19/13 1524 12/20/13 0545 12/23/13 0435  AST 39* 47* 43* 50*  ALT 15 29 32 45  ALKPHOS 82 71 55 64  BILITOT 6.2* 7.2* 7.8* 6.0*  PROT 8.5* 8.2 7.4 6.7  ALBUMIN 4.8 4.7 3.9 3.3*   No results for input(s): LIPASE, AMYLASE in the last 168 hours. No results for input(s): AMMONIA in the last 168 hours. CBC:  Recent Labs Lab 12/20/13 0545 12/21/13 0750 12/22/13 0520 12/23/13 0435 12/24/13 0434  WBC 15.6* 14.8* 15.1* 13.2* 12.0*  NEUTROABS 13.1* 12.3* 11.1* 9.5* 6.8  HGB 7.7* 8.1* 8.4* 7.1* 7.4*  HCT 20.9* 22.4* 23.6* 20.0* 21.0*  MCV 92.9 91.4 90.4 89.3 92.1  PLT 269 252 366 377 429*   Cardiac Enzymes:  Recent Labs Lab 12/19/13 2127  TROPONINI <0.30   Time coordinating  discharge:40 min  Signed:  Serina Cowper [facility] 12/24/2013, 10:32 AM

## 2013-12-26 LAB — CULTURE, BLOOD (ROUTINE X 2)
CULTURE: NO GROWTH
CULTURE: NO GROWTH
Culture: NO GROWTH
Culture: NO GROWTH

## 2014-07-07 ENCOUNTER — Encounter: Payer: Self-pay | Admitting: General Practice

## 2014-07-07 NOTE — Progress Notes (Signed)
Patient ID: Cameron [name], male   DOB: 11/18/1990, 24 y.o.   MRN: [id number] Pt arrived at primary clinic and stated that he may be experiencing a sickle cell crisis; noted that pt was seen by Dr. Ashley RoyaltyMatthews in the primary clinic in 11/2012 but no subsequent follow-up visits; per electronic records, pt last seen in ED 12/2013 and a subsequent hospital admission; pt states that he has not been seen in this clinic in 2 years because he is under the care of another primary care physician; nurse manager consulted; patient informed to go the ED for evaluation at this time; pt verbalizes understanding

## 2015-06-13 IMAGING — CT CT ANGIO CHEST
1 of 3 series · 18 of 30 positions shown · IV contrast (OMNIPAQUE 350)
Comparison: 07/22/2012

CLINICAL DATA: Back and chest pain.  Sickle cell crisis.

EXAM:
CT ANGIOGRAPHY CHEST WITH CONTRAST
TECHNIQUE: Multidetector CT imaging of the chest was performed using the
standard protocol during bolus administration of intravenous
contrast. Multiplanar CT image reconstructions and MIPs were
obtained to evaluate the vascular anatomy.
CONTRAST:  100mL OMNIPAQUE IOHEXOL 350 MG/ML SOLN

[Series 9: thins for pacs · axial · 0.65mm/px · z∈[-132,+28]mm · 18 of 181 slices shown]
[im 11/181  lung]
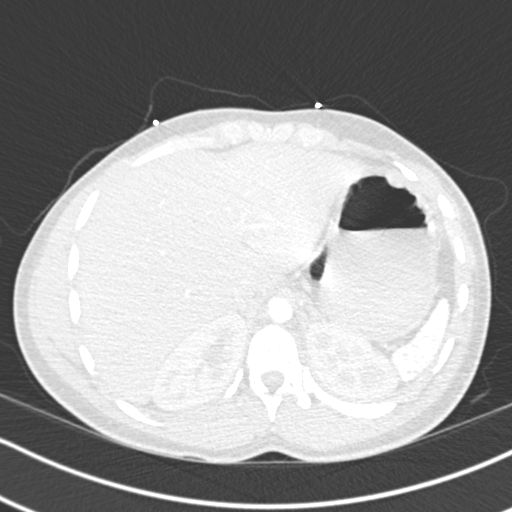
[im 21/181  mediastinal]
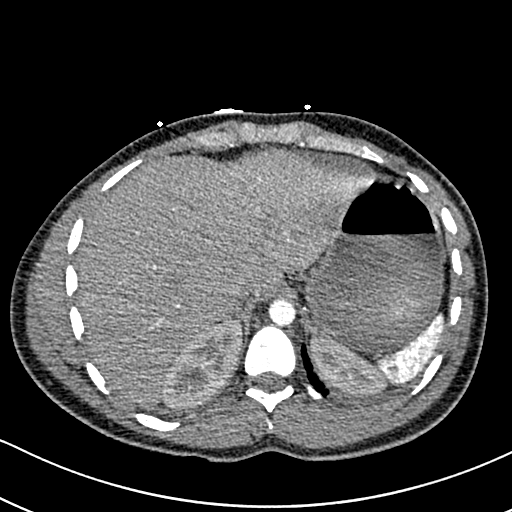
[im 31/181  lung]
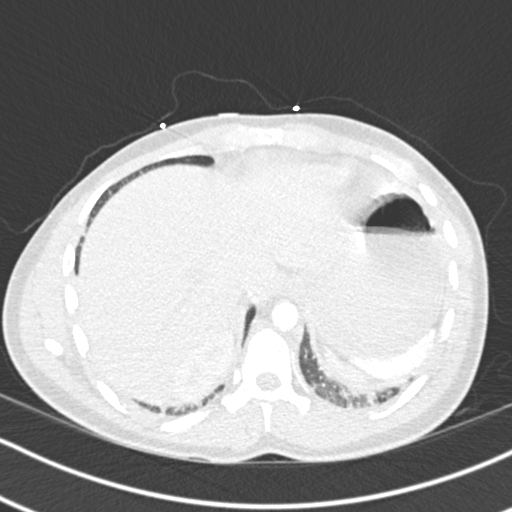
[im 41/181  mediastinal]
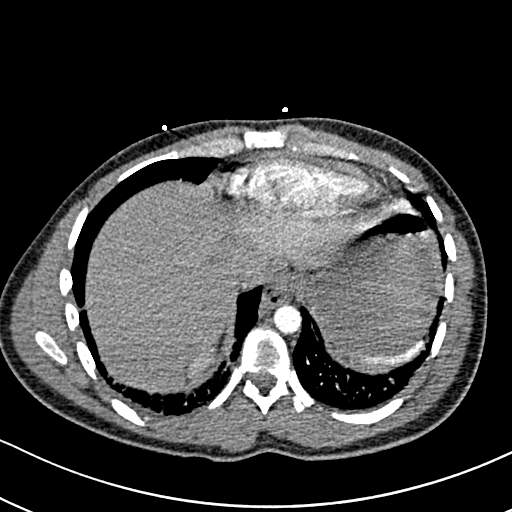
[im 51/181  lung]
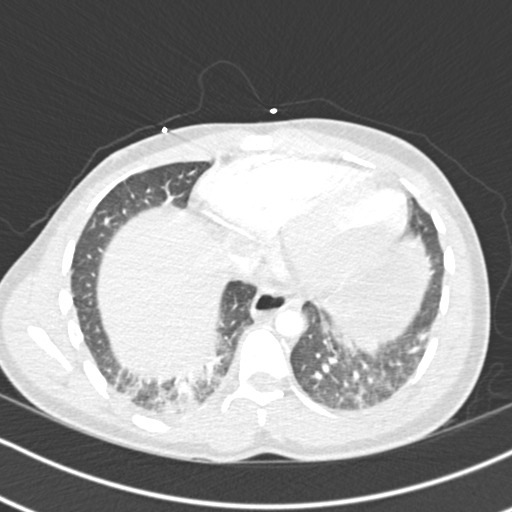
[im 61/181  mediastinal]
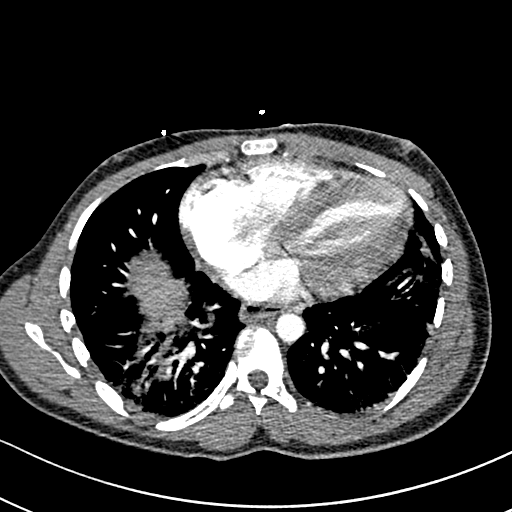
[im 71/181  lung]
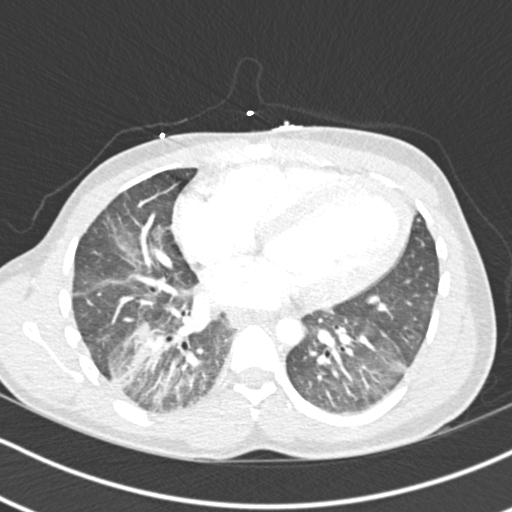
[im 81/181  mediastinal]
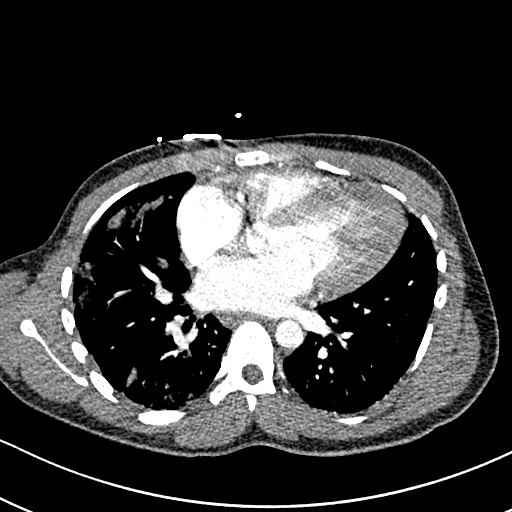
[im 91/181  lung]
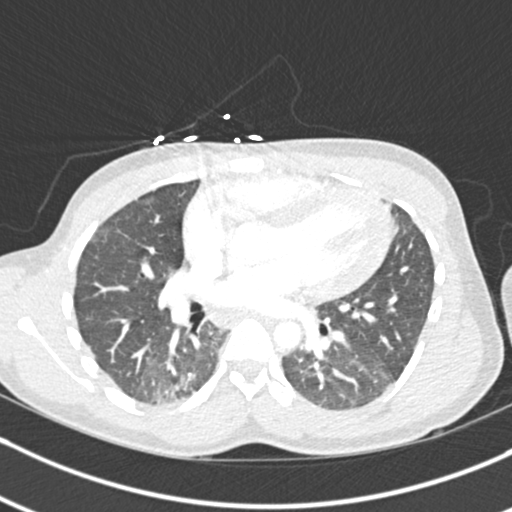
[im 101/181  mediastinal]
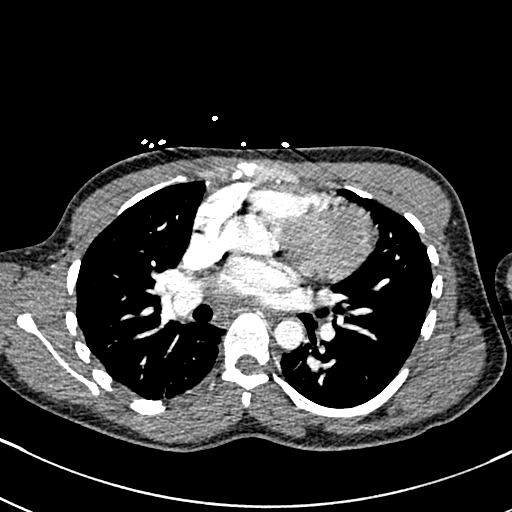
[im 102/181  lung]
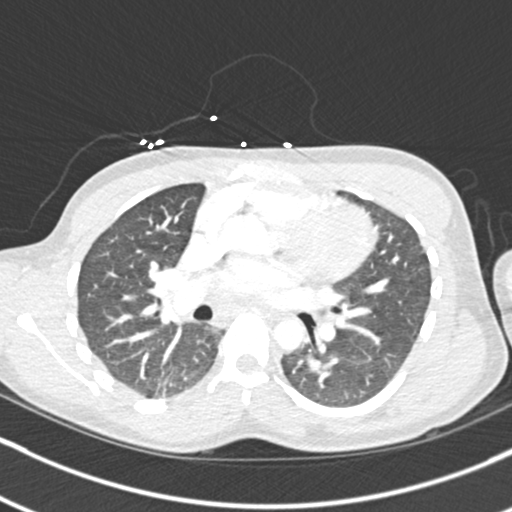
[im 111/181  mediastinal]
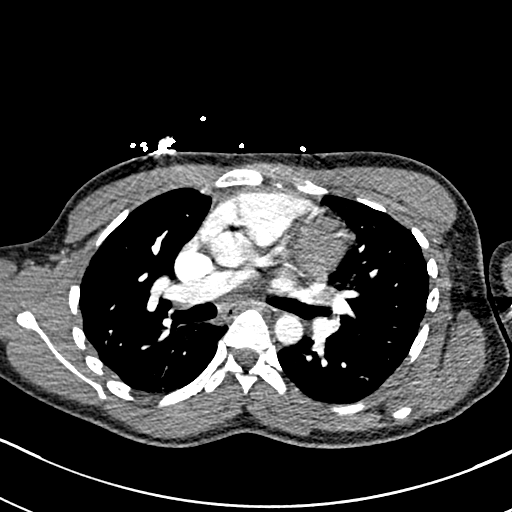
[im 121/181  lung]
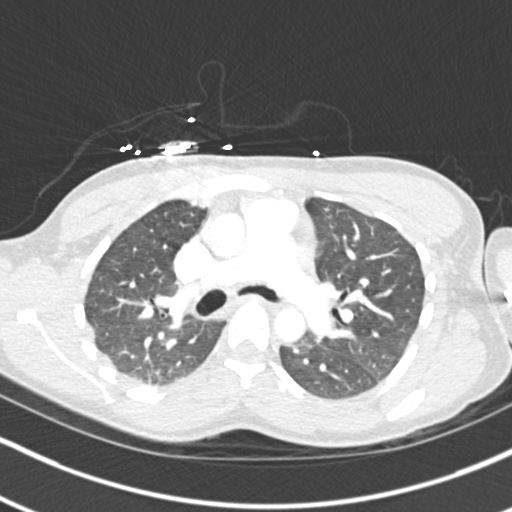
[im 131/181  mediastinal]
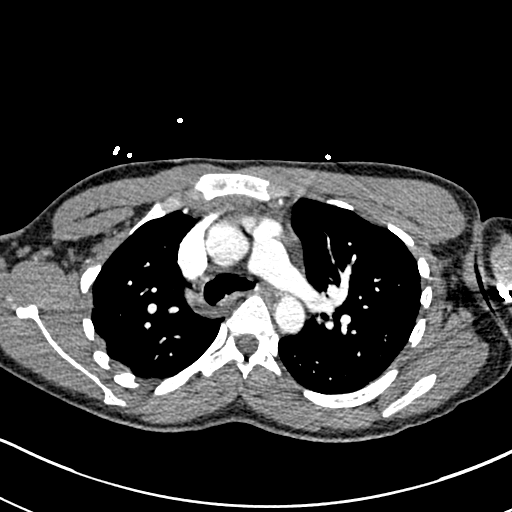
[im 141/181  lung]
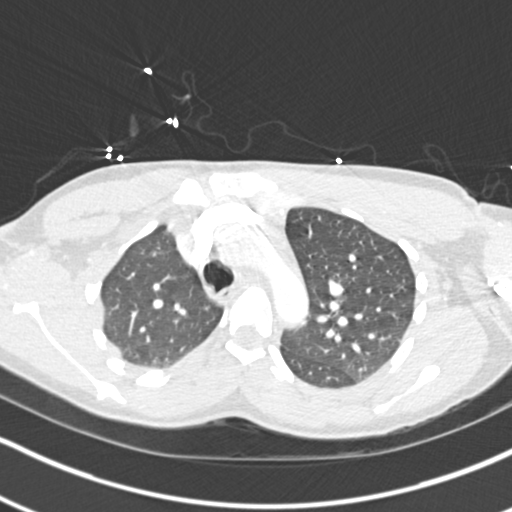
[im 151/181  mediastinal]
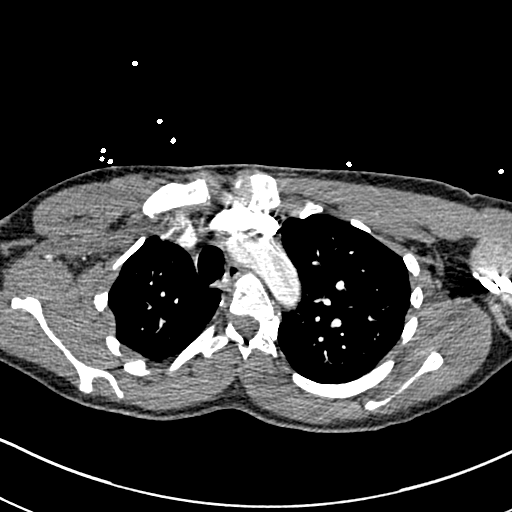
[im 161/181  lung]
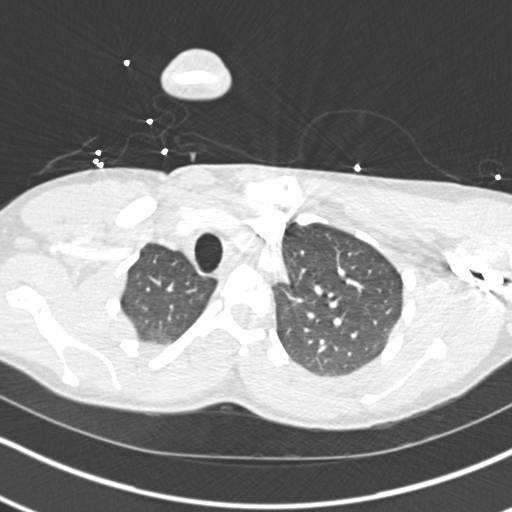
[im 171/181  mediastinal]
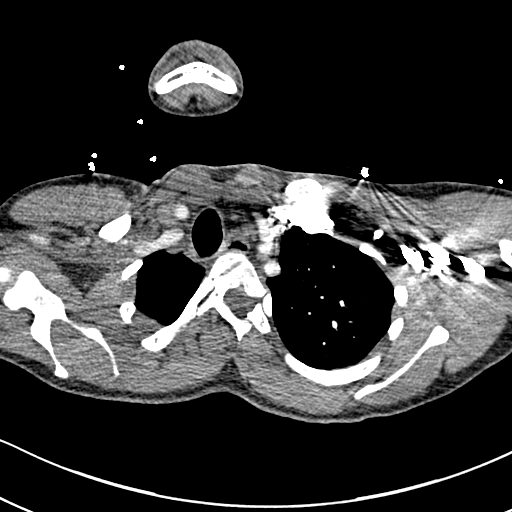

[18 of 30 positions shown; findings below may reference images not displayed]

FINDINGS: Chest wall: Unremarkable. Stable osseous changes of sickle cell
disease and stable scoliosis.

Mediastinum: Stable mild cardiac enlargement. No pericardial
effusion. No mediastinal or hilar mass or adenopathy. The aorta is
normal in caliber. No dissection. The pulmonary arteries are mildly
enlarged. No definite filling defects to suggest pulmonary embolism
but examination is quite limited due to respiratory motion.

Lungs: Limited examination by respiratory motion. There are patchy
bilateral infiltrates. No pleural effusion or pulmonary edema.

Upper abdomen: Small calcified spleen consistent with chronic sickle
cell disease.

Review of the MIP images confirms the above findings.
IMPRESSION: No CT findings for pulmonary embolism.

Bilateral infiltrates.
